# Patient Record
Sex: Male | Born: 1980 | Race: White | Hispanic: Yes | Marital: Single | Smoking: Never smoker
Health system: Southern US, Community
[De-identification: ages and names within clinical notes are randomized; demographics above are authoritative.]

## PROBLEM LIST (undated history)

## (undated) HISTORY — PX: WISDOM TOOTH EXTRACTION: SHX21

---

## 2003-03-27 ENCOUNTER — Encounter: Payer: Self-pay | Admitting: Specialist

## 2003-03-27 ENCOUNTER — Encounter: Admission: RE | Admit: 2003-03-27 | Discharge: 2003-03-27 | Payer: Self-pay | Admitting: Specialist

## 2007-12-04 ENCOUNTER — Emergency Department (HOSPITAL_COMMUNITY): Admission: EM | Admit: 2007-12-04 | Discharge: 2007-12-04 | Payer: Self-pay | Admitting: Emergency Medicine

## 2009-02-05 IMAGING — CT CT PELVIS W/ CM
2 of 5 series · 13 of 32 positions shown, 18 images · IV contrast (omnipaque)
Comparison: Ultrasound 12/04/07.

CLINICAL DATA: 26-year-old, abdominal pain, elevated liver function studies. 
 ABDOMEN CT WITH CONTRAST:
TECHNIQUE: Multidetector CT imaging of the abdomen was performed following the standard protocol during bolus administration of intravenous contrast.
 Contrast:  100 cc Omnipaque 300
TECHNIQUE: Multidetector CT imaging of the pelvis was performed following the standard protocol during bolus administration of intravenous contrast.

[Series 2: routine abdomen · axial · 0.82mm/px · z∈[-378,-78]mm · 5 of 88 slices shown, 10 images]
[im 15/88  soft-tissue]
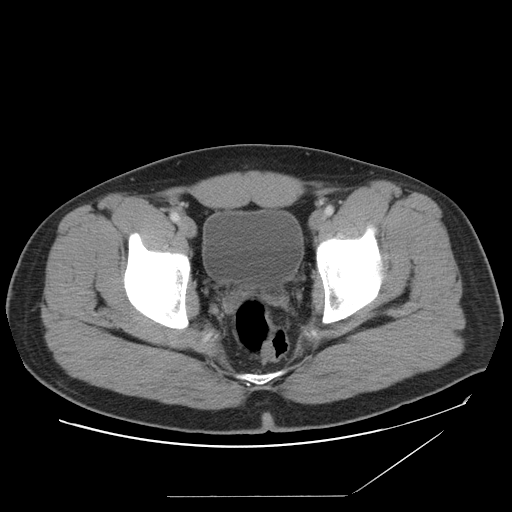
[im 15/88  bone]
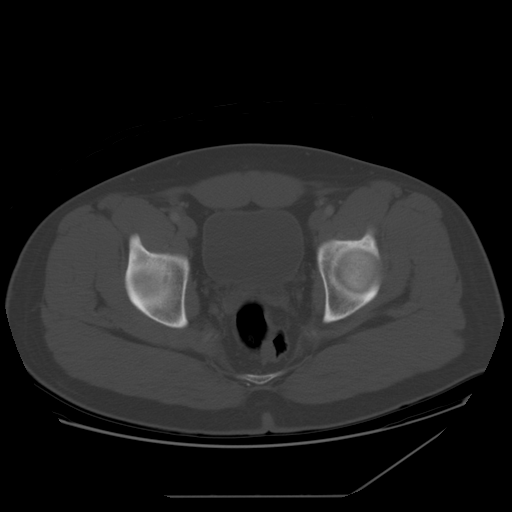
[im 30/88  soft-tissue]
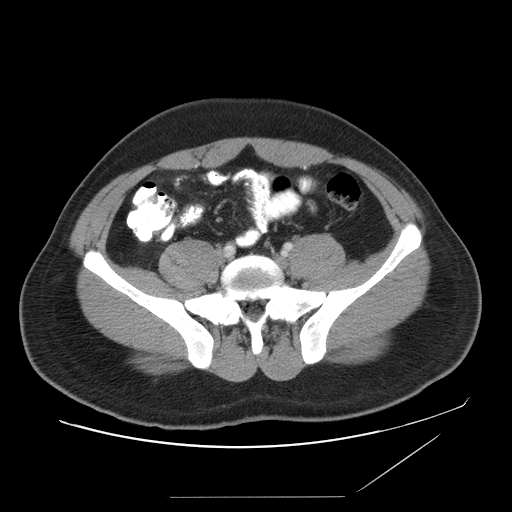
[im 30/88  lung]
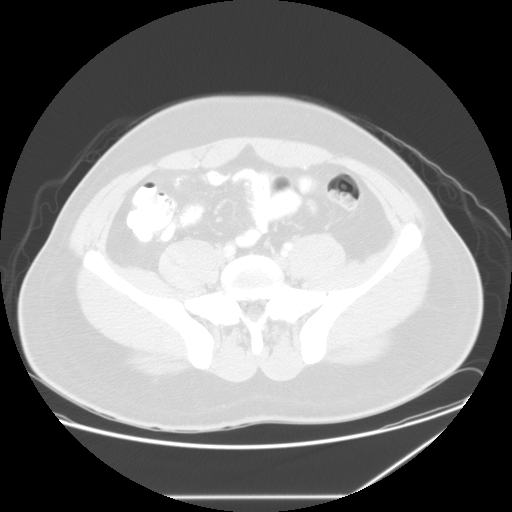
[im 44/88  soft-tissue]
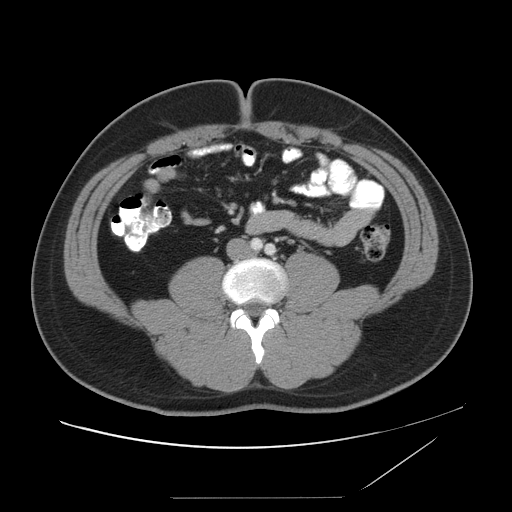
[im 44/88  lung]
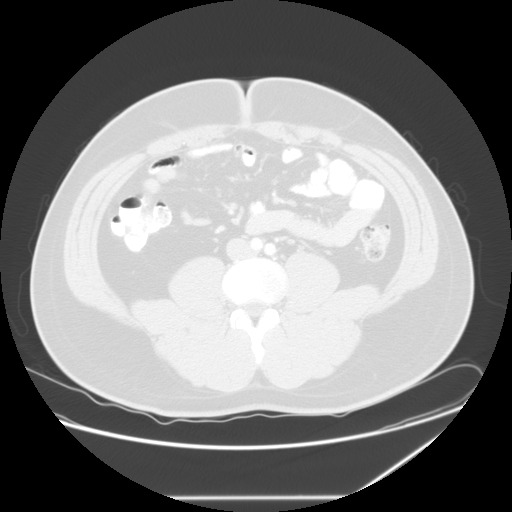
[im 59/88  soft-tissue]
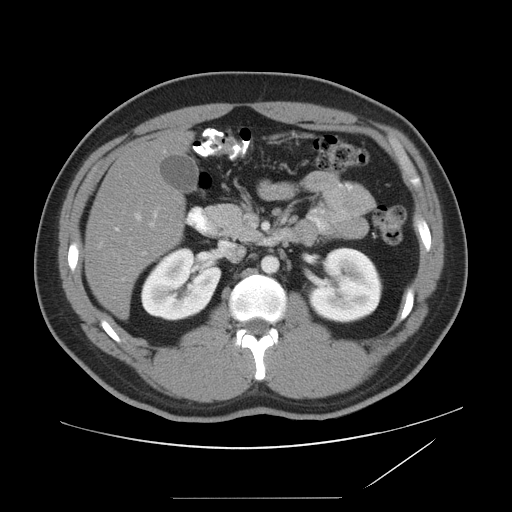
[im 59/88  lung]
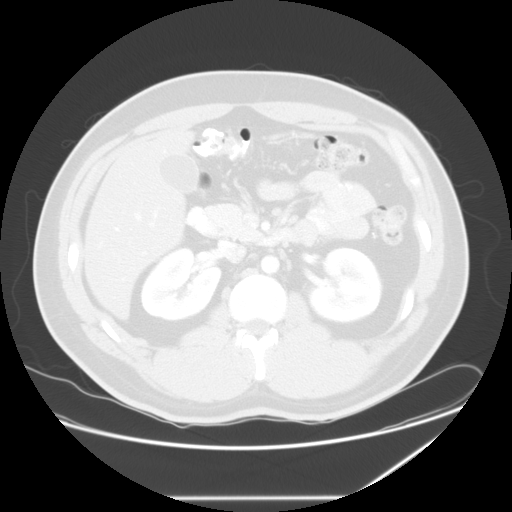
[im 73/88  soft-tissue]
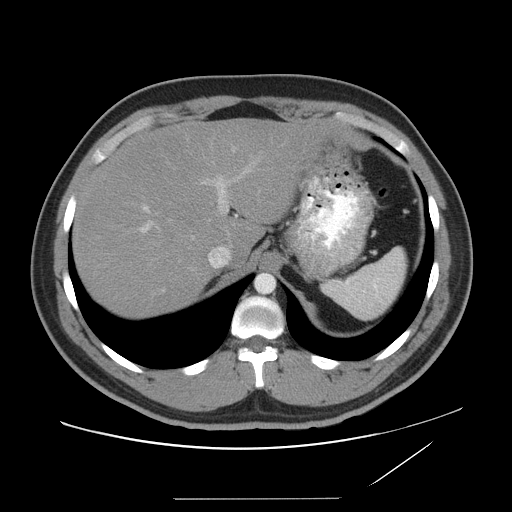
[im 73/88  lung]
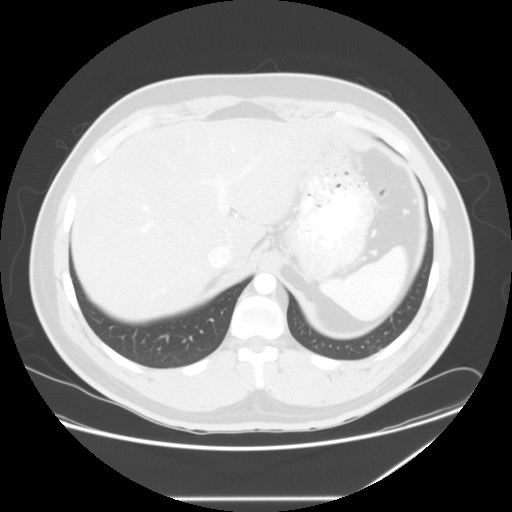

[Series 400: reformatted · sagittal · 0.96mm/px · 8 of 172 slices shown]
[im 16/172  soft-tissue]
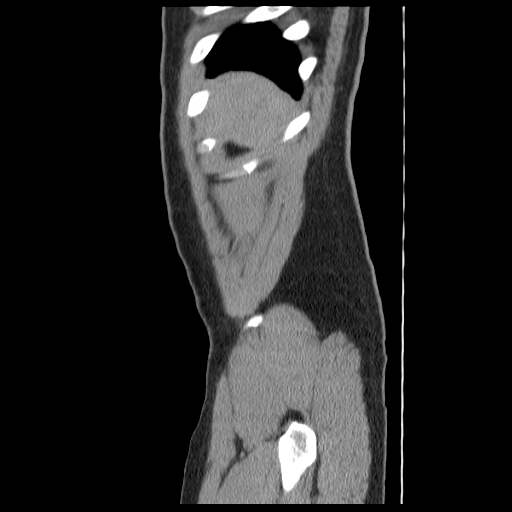
[im 32/172  soft-tissue]
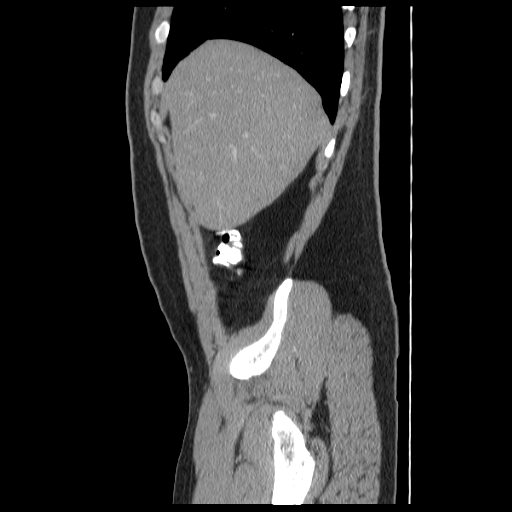
[im 63/172  soft-tissue]
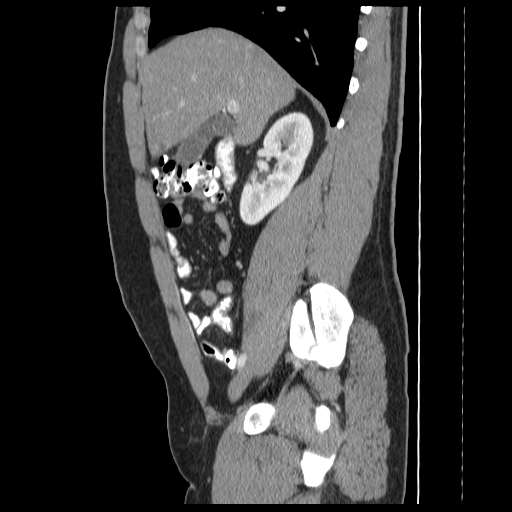
[im 78/172  soft-tissue]
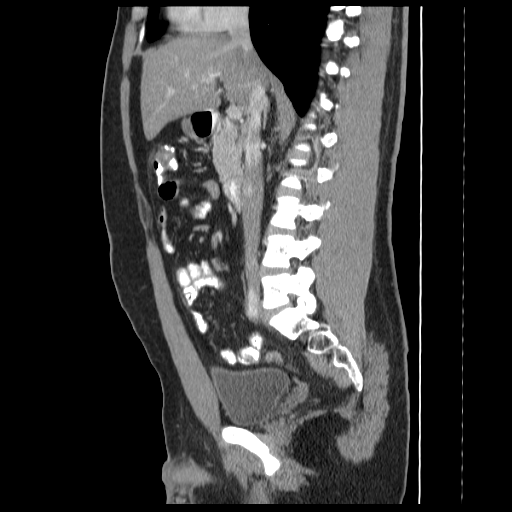
[im 94/172  soft-tissue]
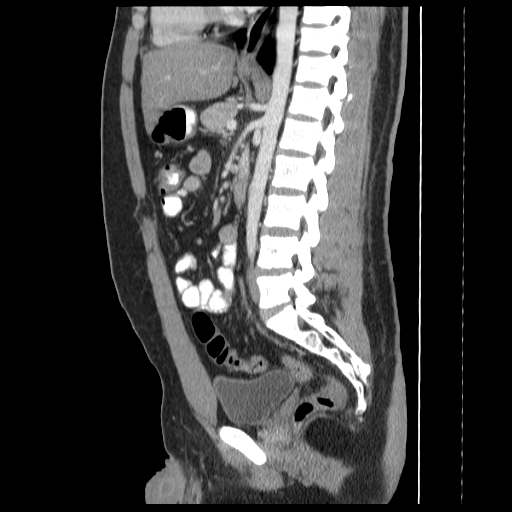
[im 109/172  soft-tissue]
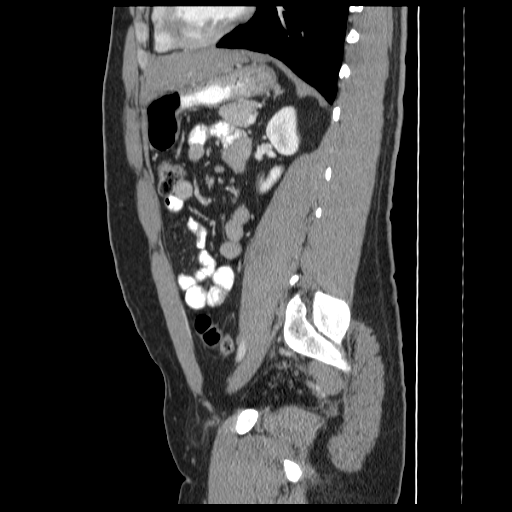
[im 140/172  soft-tissue]
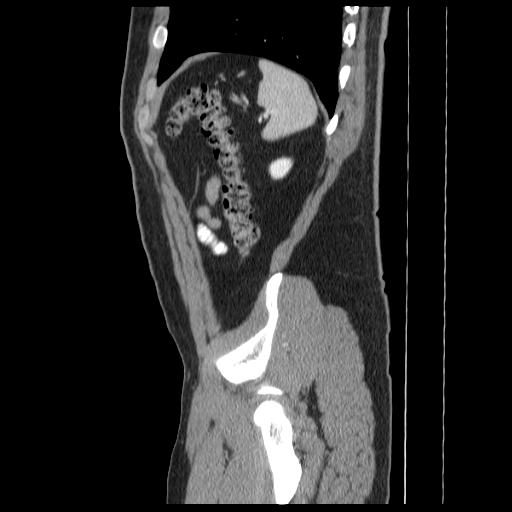
[im 156/172  soft-tissue]
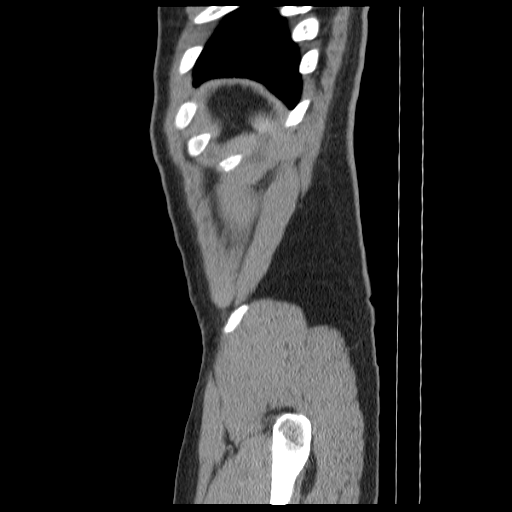

[13 of 32 positions shown; findings below may reference images not displayed]

FINDINGS: The lung bases are clear. 
 There is diffuse fatty infiltration of the liver with an area of focal fatty sparing near the gallbladder fossa which accounts for the ultrasound findings.  No biliary dilatation.  
 The spleen is normal in size.  The pancreas, adrenal glands, and kidneys are unremarkable.  
 The stomach, duodenum, small bowel, and colon demonstrate no significant abnormalities.
 There are scattered borderline mesenteric and retroperitoneal lymph nodes.  This is a nonspecific finding but may be normal in this young patient.   It could also be due to mesenteric adenitis.  
 The aorta is normal in caliber.  No dissection.  Major branch vessels are normal.  No significant bony findings.
IMPRESSION: 1.  Diffuse fatty infiltration of the liver with focal fatty sparing near the gallbladder fossa. 
 2.  Borderline enlarged mesenteric and retroperitoneal lymph nodes may suggest mesenteric adenitis. 
 3.  Remainder of the abdomen is unremarkable. 
 PELVIS CT WITH CONTRAST:
FINDINGS: The rectum, sigmoid colon, and visualized small bowel loops are unremarkable.  The appendix is visualized and is normal.  The bladder is normal.  No pelvic masses, adenopathy, or free pelvic fluid collections.  No inguinal adenopathy or inguinal hernia.  The bony pelvis is intact.
IMPRESSION: No acute pelvic findings, masses, or adenopathy.  The appendix is visualized and is normal.  Borderline enlarged pericecal lymph nodes are seen.

## 2009-08-24 ENCOUNTER — Emergency Department (HOSPITAL_COMMUNITY): Admission: EM | Admit: 2009-08-24 | Discharge: 2009-08-25 | Payer: Self-pay | Admitting: Emergency Medicine

## 2009-09-29 ENCOUNTER — Emergency Department (HOSPITAL_COMMUNITY): Admission: EM | Admit: 2009-09-29 | Discharge: 2009-09-30 | Payer: Self-pay | Admitting: Emergency Medicine

## 2009-09-30 ENCOUNTER — Ambulatory Visit: Payer: Self-pay | Admitting: Psychiatry

## 2009-09-30 ENCOUNTER — Inpatient Hospital Stay (HOSPITAL_COMMUNITY): Admission: RE | Admit: 2009-09-30 | Discharge: 2009-10-01 | Payer: Self-pay | Admitting: Psychiatry

## 2010-03-18 ENCOUNTER — Ambulatory Visit: Payer: Self-pay | Admitting: Internal Medicine

## 2011-01-02 LAB — ETHANOL: Alcohol, Ethyl (B): 253 mg/dL — ABNORMAL HIGH (ref 0–10)

## 2011-01-02 LAB — CBC
HCT: 46.2 % (ref 39.0–52.0)
Hemoglobin: 15.8 g/dL (ref 13.0–17.0)
MCV: 92.2 fL (ref 78.0–100.0)
Platelets: 296 10*3/uL (ref 150–400)
WBC: 6.9 10*3/uL (ref 4.0–10.5)

## 2011-01-02 LAB — BASIC METABOLIC PANEL
BUN: 8 mg/dL (ref 6–23)
Chloride: 106 mEq/L (ref 96–112)
Potassium: 3.8 mEq/L (ref 3.5–5.1)

## 2011-01-02 LAB — RAPID URINE DRUG SCREEN, HOSP PERFORMED
Cocaine: NOT DETECTED
Opiates: NOT DETECTED

## 2011-01-02 LAB — DIFFERENTIAL
Eosinophils Absolute: 0 10*3/uL (ref 0.0–0.7)
Eosinophils Relative: 1 % (ref 0–5)
Lymphs Abs: 3.6 10*3/uL (ref 0.7–4.0)
Monocytes Absolute: 0.5 10*3/uL (ref 0.1–1.0)
Monocytes Relative: 8 % (ref 3–12)

## 2011-01-02 LAB — HEPATIC FUNCTION PANEL
Alkaline Phosphatase: 85 U/L (ref 39–117)
Bilirubin, Direct: 0.2 mg/dL (ref 0.0–0.3)
Indirect Bilirubin: 0.7 mg/dL (ref 0.3–0.9)
Total Bilirubin: 0.9 mg/dL (ref 0.3–1.2)
Total Protein: 7.4 g/dL (ref 6.0–8.3)

## 2011-01-02 LAB — RPR: RPR Ser Ql: NONREACTIVE

## 2011-06-26 LAB — COMPREHENSIVE METABOLIC PANEL
AST: 511 — ABNORMAL HIGH
Albumin: 4
BUN: 10
CO2: 27
Calcium: 9.8
Creatinine, Ser: 0.83
GFR calc Af Amer: >60
GFR calc non Af Amer: >60
Total Bilirubin: 0.7

## 2011-06-26 LAB — URINALYSIS, ROUTINE W REFLEX MICROSCOPIC
Bilirubin Urine: NEGATIVE
Glucose, UA: NEGATIVE
Hgb urine dipstick: NEGATIVE
Ketones, ur: NEGATIVE
Protein, ur: NEGATIVE
Urobilinogen, UA: 1

## 2011-06-26 LAB — DIFFERENTIAL
Basophils Absolute: 0
Eosinophils Relative: 1
Lymphocytes Relative: 54 — ABNORMAL HIGH
Lymphs Abs: 3.5
Neutro Abs: 2.3

## 2011-06-26 LAB — CBC
HCT: 46.7
MCHC: 34.4
MCV: 89.4
Platelets: 254

## 2011-06-26 LAB — LIPASE, BLOOD: Lipase: 29

## 2012-03-19 ENCOUNTER — Encounter (HOSPITAL_COMMUNITY): Payer: Self-pay | Admitting: *Deleted

## 2012-03-19 ENCOUNTER — Emergency Department (HOSPITAL_COMMUNITY): Payer: Self-pay

## 2012-03-19 ENCOUNTER — Emergency Department (HOSPITAL_COMMUNITY)
Admission: EM | Admit: 2012-03-19 | Discharge: 2012-03-19 | Disposition: A | Discharge status: Home / Self Care | Payer: Self-pay | Attending: Emergency Medicine | Admitting: Emergency Medicine

## 2012-03-19 DIAGNOSIS — S61309A Unspecified open wound of unspecified finger with damage to nail, initial encounter: Secondary | ICD-10-CM

## 2012-03-19 DIAGNOSIS — S61209A Unspecified open wound of unspecified finger without damage to nail, initial encounter: Secondary | ICD-10-CM | POA: Insufficient documentation

## 2012-03-19 DIAGNOSIS — IMO0002 Reserved for concepts with insufficient information to code with codable children: Secondary | ICD-10-CM | POA: Insufficient documentation

## 2012-03-19 MED ORDER — HYDROCODONE-ACETAMINOPHEN 5-325 MG PO TABS: 1.0000 | ORAL_TABLET | ORAL | Status: AC | PRN

## 2012-03-19 NOTE — Discharge Instructions (Signed)
Fingernail Removal Fingernails may need to be removed because of injury, infections, or correction of abnormal growth. A special non-stick bandage has been put on your finger tightly to prevent bleeding. Fingernails will usually grow back if the finger has not been badly injured and you carefully follow instructions. HOME CARE INSTRUCTIONS   Keep your hand elevated above your heart to relieve pain and swelling.   Keep your dressing dry and clean.   Change your bandage in 24 hours.   After your bandage is changed, soak your hand in warm soapy water for 10 to 20 minutes. Do this 3 times per day. This helps reduce pain and swelling. After soaking your hand, apply a clean, dry bandage. Change your bandage if it is wet or dirty.   Only take over-the-counter or prescription medicines for pain, discomfort, or fever as directed by your caregiver.   See your caregiver as needed for problems.   You may have received an instruction to follow up with your caregiver or a specialist. The failure to follow up as instructed could result in the permanent loss of a fingernail.  SEEK IMMEDIATE MEDICAL CARE IF:   You have increased pain, swelling, drainage, or bleeding.   You have a fever.  MAKE SURE YOU:   Understand these instructions.   Will watch your condition.   Will get help right away if you are not doing well or get worse.  Document Released: 09/15/2000 Document Revised: 09/07/2011 Document Reviewed: 01/21/2008 [facility] Patient Information 2012 [place], [place].Fingernail or Toenail Loss All or part of your fingernail or toenail has been lost. This may or may not grow back as a normal nail. A special non-stick bandage has been put on your finger or toe tightly to prevent bleeding. HOME CARE INSTRUCTIONS  The tips of fingers and toes are full of nerves and injuries are often very painful. The following will help you decrease the pain and obtain the best outcome.  Keep your hand or foot elevated  above your heart to relieve pain and swelling. This will require lying in bed or on a couch with the hand or leg on pillows or sitting in a recliner with the leg up. Letting your hand or leg dangle may increase swelling, slow healing and cause throbbing pain.   Keep your dressing dry and clean.   Change your bandage in 24 hours after going home.   After your bandage is changed, soak your hand or foot in warm soapy water for 10 to 20 minutes. Do this 3 times per day. This helps reduce pain and swelling. After soaking, apply a clean, dry bandage. Change your bandage if it is wet or dirty.   Only take over-the-counter or prescription medicines for pain, discomfort, or fever as directed by your caregiver.   See your caregiver as needed for problems.  SEEK IMMEDIATE MEDICAL CARE IF:   You have increased pain, swelling, drainage, or bleeding.   You have a fever.  MAKE SURE YOU:   Understand these instructions.   Will watch your condition.   Will get help right away if you are not doing well or get worse.  Document Released: 08/10/2006 Document Revised: 09/07/2011 Document Reviewed: 10/30/2006 [facility] Patient Information 2012 [place], [place].

## 2012-03-19 NOTE — ED Notes (Signed)
The pt  Struck his lt ring finger with a brick one week ago.  Now the fingernail is free from the nail bed.

## 2012-03-19 NOTE — ED Notes (Signed)
PT states no needs at this time

## 2012-03-19 NOTE — ED Provider Notes (Signed)
Medical screening examination/treatment/procedure(s) were performed by non-physician practitioner and as supervising physician I was immediately available for consultation/collaboration.   Vasti Yagi, MD 03/19/12 2309 

## 2012-03-19 NOTE — ED Provider Notes (Signed)
History     CSN: [id number]  Arrival date & time 03/19/12  1927   First MD Initiated Contact with Patient 03/19/12 2041      Chief Complaint  Patient presents with  . Finger Injury    (Consider location/radiation/quality/duration/timing/severity/associated sxs/prior treatment) HPI Comments: Patient here after hitting his left 4th finger with a brick - states that he did this 1 week ago and now reports that the nail is beginning to come off - has been working with the injury - tetanus UTD - hemostatic  Patient is a 31 y.o. male presenting with hand pain. The history is provided by the patient. No language interpreter was used.  Hand Pain This is a new problem. The current episode started in the past 7 days. The problem occurs constantly. The problem has been unchanged. Associated symptoms include arthralgias and joint swelling. Pertinent negatives include no abdominal pain, anorexia, change in bowel habit, chest pain, chills, congestion, coughing, diaphoresis, fatigue, fever, headaches, myalgias, nausea, neck pain, numbness, rash, sore throat, swollen glands, urinary symptoms, vertigo, visual change, vomiting or weakness. The symptoms are aggravated by bending. He has tried nothing for the symptoms. The treatment provided no relief.    History reviewed. No pertinent past medical history.  History reviewed. No pertinent past surgical history.  No family history on file.  History  Substance Use Topics  . Smoking status: Never Smoker   . Smokeless tobacco: Not on file  . Alcohol Use: Yes      Review of Systems  Constitutional: Negative for fever, chills, diaphoresis and fatigue.  HENT: Negative for congestion, sore throat and neck pain.   Respiratory: Negative for cough.   Cardiovascular: Negative for chest pain.  Gastrointestinal: Negative for nausea, vomiting, abdominal pain, anorexia and change in bowel habit.  Musculoskeletal: Positive for joint swelling and arthralgias.  Negative for myalgias.  Skin: Negative for rash.  Neurological: Negative for vertigo, weakness, numbness and headaches.  All other systems reviewed and are negative.    Allergies  Review of patient's allergies indicates no known allergies.  Home Medications   Current Outpatient Rx  Name Route Sig Dispense Refill  . HYDROCODONE-ACETAMINOPHEN 5-325 MG PO TABS Oral Take 1 tablet by mouth every 4 (four) hours as needed for pain. 20 tablet 0    BP 135/93  Pulse 74  Temp 97.8 F (36.6 C) (Oral)  Resp 18  SpO2 98%  Physical Exam  Nursing note and vitals reviewed. Constitutional: He is oriented to person, place, and time. He appears well-developed and well-nourished. No distress.  HENT:  Head: Normocephalic and atraumatic.  Right Ear: External ear normal.  Left Ear: External ear normal.  Nose: Nose normal.  Mouth/Throat: Oropharynx is clear and moist. No oropharyngeal exudate.  Eyes: Conjunctivae are normal. Pupils are equal, round, and reactive to light. No scleral icterus.  Neck: Normal range of motion. Neck supple.  Cardiovascular: Normal rate, regular rhythm and normal heart sounds.  Exam reveals no gallop and no friction rub.   No murmur heard. Pulmonary/Chest: Effort normal and breath sounds normal. No respiratory distress. He has no wheezes. He has no rales. He exhibits no tenderness.  Abdominal: Soft. Bowel sounds are normal. He exhibits no distension. There is no tenderness.  Musculoskeletal: Normal range of motion. He exhibits tenderness. He exhibits no edema.  Lymphadenopathy:    He has no cervical adenopathy.  Neurological: He is alert and oriented to person, place, and time. No cranial nerve deficit. He exhibits normal muscle tone.  Coordination normal.  Skin: Skin is warm and dry. No rash noted. No erythema. No pallor.       Partial avulsion of left 4th finger nail  Psychiatric: He has a normal mood and affect. His behavior is normal. Judgment and thought content  normal.    ED Course  NAIL REMOVAL Date/Time: 03/19/2012 10:43 PM Performed by: Marisue Humble, Jenafer Winterton C. Authorized by: Patrecia Pour Consent: Verbal consent obtained. Written consent not obtained. Risks and benefits: risks, benefits and alternatives were discussed Consent given by: patient and spouse Patient understanding: patient states understanding of the procedure being performed Patient consent: the patient's understanding of the procedure does not match consent given Procedure consent: procedure consent does not match procedure scheduled Relevant documents: relevant documents not present or verified Test results: test results not available Site marked: the operative site was not marked Imaging studies: imaging studies not available Patient identity confirmed: verbally with patient and arm band Time out: Immediately prior to procedure a "time out" was called to verify the correct patient, procedure, equipment, support staff and site/side marked as required. Location: left hand Location details: left ring finger Anesthesia: digital block Local anesthetic: lidocaine 1% without epinephrine Anesthetic total: 4 ml Patient sedated: no Preparation: skin prepped with Betadine Amount removed: complete Wedge excision of skin of nail fold: no Nail bed sutured: no Nail matrix removed: none Removed nail replaced and anchored: no Dressing: antibiotic ointment, gauze roll and splint Patient tolerance: Patient tolerated the procedure well with no immediate complications.   (including critical care time)  Labs Reviewed - No data to display Dg Finger Ring Left  03/19/2012  *RADIOLOGY REPORT*  Clinical Data: Finger injury  LEFT RING FINGER 2+V  Comparison: None.  Findings: Three views of the left fourth finger submitted.  No acute fracture or subluxation.  Soft tissue swelling/irregularity noted dorsal aspect of distal finger  IMPRESSION: No acute fracture or subluxation.  Soft tissue  injury noted dorsal distal aspect of the finger.  Original Report Authenticated By: Natasha Mead, M.D.     1. Nail avulsion, finger       MDM  Patient here with partial avulsion of nail with detachment of the nail from the bed, remainder of nail removed and nail bed examined - small hematoma noted but no laceration of the bed noted.  Placed in splint and bacitracin ointment.  Patient did have a vasovagal response after removal of nail and passed out for about 30 seconds, came to spontaneously without complications.        Izola Price [place], [place] 03/19/12 2246

## 2012-04-02 ENCOUNTER — Emergency Department (HOSPITAL_COMMUNITY)
Admission: EM | Admit: 2012-04-02 | Discharge: 2012-04-02 | Disposition: A | Discharge status: Home / Self Care | Payer: Self-pay | Attending: Emergency Medicine | Admitting: Emergency Medicine

## 2012-04-02 ENCOUNTER — Encounter (HOSPITAL_COMMUNITY): Payer: Self-pay

## 2012-04-02 DIAGNOSIS — T622X1A Toxic effect of other ingested (parts of) plant(s), accidental (unintentional), initial encounter: Secondary | ICD-10-CM | POA: Insufficient documentation

## 2012-04-02 DIAGNOSIS — L259 Unspecified contact dermatitis, unspecified cause: Secondary | ICD-10-CM

## 2012-04-02 DIAGNOSIS — L255 Unspecified contact dermatitis due to plants, except food: Secondary | ICD-10-CM | POA: Insufficient documentation

## 2012-04-02 MED ORDER — PREDNISONE 20 MG PO TABS
40.0000 mg | ORAL_TABLET | Freq: Once | ORAL | Status: AC
  Administered 2012-04-02: 40 mg via ORAL
  Filled 2012-04-02: qty 2

## 2012-04-02 MED ORDER — PREDNISONE 20 MG PO TABS: 40.0000 mg | ORAL_TABLET | Freq: Every day | ORAL | Status: AC

## 2012-04-02 MED ORDER — HYDROXYZINE HCL 25 MG PO TABS: 25.0000 mg | ORAL_TABLET | Freq: Four times a day (QID) | ORAL | Status: AC

## 2012-04-02 MED ORDER — DEXAMETHASONE SODIUM PHOSPHATE 10 MG/ML IJ SOLN
10.0000 mg | Freq: Once | INTRAMUSCULAR | Status: AC
  Administered 2012-04-02: 10 mg via INTRAVENOUS
  Filled 2012-04-02: qty 1

## 2012-04-02 MED ORDER — PREDNISONE 20 MG PO TABS: 60.0000 mg | ORAL_TABLET | Freq: Every day | ORAL | Status: AC

## 2012-04-02 NOTE — ED Provider Notes (Signed)
History     CSN: [id number]  Arrival date & time 04/02/12  1939   First MD Initiated Contact with Patient 04/02/12 2057      Chief Complaint  Patient presents with  . Rash    (Consider location/radiation/quality/duration/timing/severity/associated sxs/prior treatment) Patient is a 31 y.o. male presenting with rash. The history is provided by the patient and the spouse.  Rash  There has been no fever.   31 y/op male iNAd c/o itching rash to bilateral arms and legs x1.5 weeks after doing yard work. Pt knows he was exposed to poison ivy. He put 1:1 water to bleach solution to rinse the area.   History reviewed. No pertinent past medical history.  History reviewed. No pertinent past surgical history.  History reviewed. No pertinent family history.  History  Substance Use Topics  . Smoking status: Never Smoker   . Smokeless tobacco: Not on file  . Alcohol Use: Yes     approx 100oz per day of beer.      Review of Systems  Skin: Positive for rash.  All other systems reviewed and are negative.    Allergies  Anesthetics, amide  Home Medications   Current Outpatient Rx  Name Route Sig Dispense Refill  . PREDNISONE 20 MG PO TABS Oral Take 2 tablets (40 mg total) by mouth daily. 20 tablet 0    BP 140/77  Pulse 72  Temp 98.1 F (36.7 C) (Oral)  Resp 16  SpO2 99%  Physical Exam  Vitals reviewed. Constitutional: He is oriented to person, place, and time. He appears well-developed and well-nourished. No distress.  HENT:  Head: Normocephalic.  Eyes: Conjunctivae and EOM are normal.  Cardiovascular: Normal rate.   Pulmonary/Chest: Effort normal.  Musculoskeletal: Normal range of motion.  Neurological: He is alert and oriented to person, place, and time.  Skin:       excoriated lesions bilateral distal arms and lower legs. No signs of infection: redness warmth, tenderness or discharge  Psychiatric: He has a normal mood and affect.    ED Course  Procedures  (including critical care time)  Labs Reviewed - No data to display No results found.   1. Contact dermatitis   2. Contact dermatitis due to plants, except food       MDM  Pruritic rash consistent with poison ivy. No signs if secondary infection. Will give 10 mg decadron IM and write for 21 day steriod taper and give atarax for symptomatic relief       Wynetta Emery, PA-C 04/02/12 2140

## 2012-04-02 NOTE — ED Notes (Signed)
States he was mowing the yard about 1.5 wks ago and states it is poison ivy.  Has a rash to arms and legs. States it is getting worse.

## 2012-04-10 NOTE — ED Provider Notes (Signed)
Medical screening examination/treatment/procedure(s) were performed by non-physician practitioner and as supervising physician I was immediately available for consultation/collaboration.  Cambridge Deleo, MD 04/10/12 0851 

## 2014-06-09 ENCOUNTER — Encounter (HOSPITAL_COMMUNITY): Payer: Self-pay | Admitting: Emergency Medicine

## 2014-06-09 ENCOUNTER — Emergency Department (HOSPITAL_COMMUNITY)
Admission: EM | Admit: 2014-06-09 | Discharge: 2014-06-09 | Disposition: A | Discharge status: Home / Self Care | Payer: Medicaid Other | Attending: Emergency Medicine | Admitting: Emergency Medicine

## 2014-06-09 DIAGNOSIS — M545 Low back pain, unspecified: Secondary | ICD-10-CM | POA: Insufficient documentation

## 2014-06-09 DIAGNOSIS — M5432 Sciatica, left side: Secondary | ICD-10-CM

## 2014-06-09 DIAGNOSIS — R61 Generalized hyperhidrosis: Secondary | ICD-10-CM | POA: Diagnosis not present

## 2014-06-09 DIAGNOSIS — M5430 Sciatica, unspecified side: Secondary | ICD-10-CM | POA: Diagnosis not present

## 2014-06-09 DIAGNOSIS — Z79899 Other long term (current) drug therapy: Secondary | ICD-10-CM | POA: Diagnosis not present

## 2014-06-09 MED ORDER — OXYCODONE-ACETAMINOPHEN 10-325 MG PO TABS: 1.0000 | ORAL_TABLET | ORAL | Status: DC | PRN

## 2014-06-09 MED ORDER — MELOXICAM 15 MG PO TABS: 15.0000 mg | ORAL_TABLET | Freq: Every day | ORAL | Status: DC

## 2014-06-09 MED ORDER — OXYCODONE-ACETAMINOPHEN 5-325 MG PO TABS
2.0000 | ORAL_TABLET | Freq: Once | ORAL | Status: AC
  Administered 2014-06-09: 2 via ORAL
  Filled 2014-06-09: qty 2

## 2014-06-09 MED ORDER — ONDANSETRON HCL 4 MG/2ML IJ SOLN: 4.0000 mg | Freq: Once | INTRAMUSCULAR | Status: DC

## 2014-06-09 NOTE — ED Notes (Signed)
Pt reports left lower back pain x 4 months that is worse today with radiating pain down to left leg to level of the knee. Denies bowel or bladder problems but does reports that it hurts his back to sit and have a bowel movement. Last BM yesterday. He denies fall or injury or numbness to his toes or foot.

## 2014-06-09 NOTE — ED Provider Notes (Signed)
CSN: [id number]     Arrival date & time 06/09/14  1011 History   First MD Initiated Contact with Patient 06/09/14 1024    This chart was scribed for Arthor Captain, PA, with Ward Givens, MD by Tonye Royalty, ED Scribe. This patient was seen in room WTR8/WTR8 and the patient's care was started at 12:07 PM.   Chief Complaint  Patient presents with  . Back Pain   The history is provided by the patient. No language interpreter was used.    HPI Comments: Brian Kim is a 33 y.o. male who presents to the Emergency Department complaining of sharp constant back pain from his lower back down to left knee with onset 3 months ago. He recalls lifting something heavy a few days before onset of pain. He states pain is better when standing and moving around but worse when sitting or laying. He states the pain worsened 3 weeks ago but denies knowledge of what might have made made it worse. He states he is current taking OTC pain medication without significant relief; he states he was previously prescribed pain medication that mediated pain but did not resolve it. He reports associated night sweats from the waist down and difficulty using the restroom. He denies numbness, tingling, weakness, loss of bladder, loss of function, fever, or chillls.  History reviewed. No pertinent past medical history. History reviewed. No pertinent past surgical history. No family history on file. History  Substance Use Topics  . Smoking status: Never Smoker   . Smokeless tobacco: Not on file  . Alcohol Use: Yes     Comment: approx 100oz per day of beer.    Review of Systems  Constitutional: Positive for diaphoresis (night sweats). Negative for fever and chills.  Musculoskeletal: Positive for back pain (stretching down through buttock to back of left knee).  Neurological: Negative for weakness, light-headedness and numbness.       Denies loss of bowel or bladder    Allergies  Anesthetics, amide  Home Medications    Prior to Admission medications   Medication Sig Start Date End Date Taking? Authorizing Provider  acetaminophen (TYLENOL) 325 MG tablet Take 650-975 mg by mouth every 6 (six) hours as needed for moderate pain.   Yes Historical Provider, MD  ibuprofen (ADVIL,MOTRIN) 200 MG tablet Take 1,200 mg by mouth every 6 (six) hours as needed for moderate pain.   Yes Historical Provider, MD  meloxicam (MOBIC) 15 MG tablet Take 1 tablet (15 mg total) by mouth daily. 06/09/14   Arthor Captain, PA-C  oxyCODONE-acetaminophen (PERCOCET) 10-325 MG per tablet Take 1 tablet by mouth every 4 (four) hours as needed for pain. 06/09/14   Arturo Freundlich, PA-C   BP 126/80  Pulse 80  Temp(Src) 98.6 F (37 C) (Oral)  Resp 18  Wt 199 lb (90.266 kg)  SpO2 100% Physical Exam  Nursing note and vitals reviewed. Constitutional: He is oriented to person, place, and time. He appears well-developed and well-nourished.  HENT:  Head: Normocephalic and atraumatic.  Eyes: Conjunctivae are normal.  Neck: Normal range of motion. Neck supple.  Pulmonary/Chest: Effort normal.  Musculoskeletal: Normal range of motion.  Positive straight leg raise  Neurological: He is alert and oriented to person, place, and time. He displays normal reflexes.  Normal strength in legs  Skin: Skin is warm and dry.  Psychiatric: He has a normal mood and affect.    ED Course  Procedures (including critical care time) Labs Review Labs Reviewed -  No data to display  Imaging Review No results found.   EKG Interpretation None     DIAGNOSTIC STUDIES: Oxygen Saturation is 100% on room air, normal by my interpretation.    COORDINATION OF CARE:    MDM   Final diagnoses:  Sciatica, left   Patient with back pain.  No neurological deficits and normal neuro exam.  Patient can walk but states is painful.  No loss of bowel or bladder control.  No concern for cauda equina.  No fever, night sweats, weight loss, h/o cancer, IVDU.  RICE protocol  and pain medicine indicated and discussed with patient.   I personally performed the services described in this documentation, which was scribed in my presence. The recorded information has been reviewed and is accurate.      Arthor Captain, PA-C 06/14/14 1936

## 2014-06-09 NOTE — Discharge Instructions (Signed)
Sciatica °Sciatica is pain, weakness, numbness, or tingling along the path of the sciatic nerve. The nerve starts in the lower back and runs down the back of each leg. The nerve controls the muscles in the lower leg and in the back of the knee, while also providing sensation to the back of the thigh, lower leg, and the sole of your foot. Sciatica is a symptom of another medical condition. For instance, nerve damage or certain conditions, such as a herniated disk or bone spur on the spine, pinch or put pressure on the sciatic nerve. This causes the pain, weakness, or other sensations normally associated with sciatica. Generally, sciatica only affects one side of the body. °CAUSES  °· Herniated or slipped disc. °· Degenerative disk disease. °· A pain disorder involving the narrow muscle in the buttocks (piriformis syndrome). °· Pelvic injury or fracture. °· Pregnancy. °· Tumor (rare). °SYMPTOMS  °Symptoms can vary from mild to very severe. The symptoms usually travel from the low back to the buttocks and down the back of the leg. Symptoms can include: °· Mild tingling or dull aches in the lower back, leg, or hip. °· Numbness in the back of the calf or sole of the foot. °· Burning sensations in the lower back, leg, or hip. °· Sharp pains in the lower back, leg, or hip. °· Leg weakness. °· Severe back pain inhibiting movement. °These symptoms may get worse with coughing, sneezing, laughing, or prolonged sitting or standing. Also, being overweight may worsen symptoms. °DIAGNOSIS  °Your caregiver will perform a physical exam to look for common symptoms of sciatica. He or she may ask you to do certain movements or activities that would trigger sciatic nerve pain. Other tests may be performed to find the cause of the sciatica. These may include: °· Blood tests. °· X-rays. °· Imaging tests, such as an MRI or CT scan. °TREATMENT  °Treatment is directed at the cause of the sciatic pain. Sometimes, treatment is not necessary  and the pain and discomfort goes away on its own. If treatment is needed, your caregiver may suggest: °· Over-the-counter medicines to relieve pain. °· Prescription medicines, such as anti-inflammatory medicine, muscle relaxants, or narcotics. °· Applying heat or ice to the painful area. °· Steroid injections to lessen pain, irritation, and inflammation around the nerve. °· Reducing activity during periods of pain. °· Exercising and stretching to strengthen your abdomen and improve flexibility of your spine. Your caregiver may suggest losing weight if the extra weight makes the back pain worse. °· Physical therapy. °· Surgery to eliminate what is pressing or pinching the nerve, such as a bone spur or part of a herniated disk. °HOME CARE INSTRUCTIONS  °· Only take over-the-counter or prescription medicines for pain or discomfort as directed by your caregiver. °· Apply ice to the affected area for 20 minutes, 3-4 times a day for the first 48-72 hours. Then try heat in the same way. °· Exercise, stretch, or perform your usual activities if these do not aggravate your pain. °· Attend physical therapy sessions as directed by your caregiver. °· Keep all follow-up appointments as directed by your caregiver. °· Do not wear high heels or shoes that do not provide proper support. °· Check your mattress to see if it is too soft. A firm mattress may lessen your pain and discomfort. °SEEK IMMEDIATE MEDICAL CARE IF:  °· You lose control of your bowel or bladder (incontinence). °· You have increasing weakness in the lower back, pelvis, buttocks,   or legs. °· You have redness or swelling of your back. °· You have a burning sensation when you urinate. °· You have pain that gets worse when you lie down or awakens you at night. °· Your pain is worse than you have experienced in the past. °· Your pain is lasting longer than 4 weeks. °· You are suddenly losing weight without reason. °MAKE SURE YOU: °· Understand these  instructions. °· Will watch your condition. °· Will get help right away if you are not doing well or get worse. °Document Released: 09/12/2001 Document Revised: 03/19/2012 Document Reviewed: 01/28/2012 °[employer]® Patient Information ©2015 [employer]. This information is not intended to replace advice given to you by your health care provider. Make sure you discuss any questions you have with your health care provider. ° °Sciatica °with Rehab °The sciatic nerve runs from the back down the leg and is responsible for sensation and control of the muscles in the back (posterior) side of the thigh, lower leg, and foot. Sciatica is a condition that is characterized by inflammation of this nerve.  °SYMPTOMS  °· Signs of nerve damage, including numbness and/or weakness along the posterior side of the lower extremity. °· Pain in the back of the thigh that may also travel down the leg. °· Pain that worsens when sitting for long periods of time. °· Occasionally, pain in the back or buttock. °CAUSES  °Inflammation of the sciatic nerve is the cause of sciatica. The inflammation is due to something irritating the nerve. Common sources of irritation include: °· Sitting for long periods of time. °· Direct trauma to the nerve. °· Arthritis of the spine. °· Herniated or ruptured disk. °· Slipping of the vertebrae (spondylolisthesis). °· Pressure from soft tissues, such as muscles or ligament-like tissue (fascia). °RISK INCREASES WITH: °· Sports that place pressure or stress on the spine (football or weightlifting). °· Poor strength and flexibility. °· Failure to warm up properly before activity. °· Family history of low back pain or disk disorders. °· Previous back injury or surgery. °· Poor body mechanics, especially when lifting, or poor posture. °PREVENTION  °· Warm up and stretch properly before activity. °· Maintain physical fitness: °¨ Strength, flexibility, and endurance. °· Cardiovascular fitness. °· Learn and use proper  technique, especially with posture and lifting. When possible, have coach correct improper technique. °· Avoid activities that place stress on the spine. °PROGNOSIS °If treated properly, then sciatica usually resolves within 6 weeks. However, occasionally surgery is necessary.  °RELATED COMPLICATIONS  °· Permanent nerve damage, including pain, numbness, tingle, or weakness. °· Chronic back pain. °· Risks of surgery: infection, bleeding, nerve damage, or damage to surrounding tissues. °TREATMENT °Treatment initially involves resting from any activities that aggravate your symptoms. The use of ice and medication may help reduce pain and inflammation. The use of strengthening and stretching exercises may help reduce pain with activity. These exercises may be performed at home or with referral to a therapist. A therapist may recommend further treatments, such as transcutaneous electronic nerve stimulation (TENS) or ultrasound. Your caregiver may recommend corticosteroid injections to help reduce inflammation of the sciatic nerve. If symptoms persist despite non-surgical (conservative) treatment, then surgery may be recommended. °MEDICATION °· If pain medication is necessary, then nonsteroidal anti-inflammatory medications, such as aspirin and ibuprofen, or other minor pain relievers, such as acetaminophen, are often recommended. °· Do not take pain medication for 7 days before surgery. °· Prescription pain relievers may be given if deemed necessary by your caregiver.   Use only as directed and only as much as you need. °· Ointments applied to the skin may be helpful. °· Corticosteroid injections may be given by your caregiver. These injections should be reserved for the most serious cases, because they may only be given a certain number of times. °HEAT AND COLD °· Cold treatment (icing) relieves pain and reduces inflammation. Cold treatment should be applied for 10 to 15 minutes every 2 to 3 hours for inflammation and  pain and immediately after any activity that aggravates your symptoms. Use ice packs or massage the area with a piece of ice (ice massage). °· Heat treatment may be used prior to performing the stretching and strengthening activities prescribed by your caregiver, physical therapist, or athletic trainer. Use a heat pack or soak the injury in warm water. °SEEK MEDICAL CARE IF: °· Treatment seems to offer no benefit, or the condition worsens. °· Any medications produce adverse side effects. °EXERCISES  °RANGE OF MOTION (ROM) AND STRETCHING EXERCISES - Sciatica °Most people with sciatic will find that their symptoms worsen with either excessive bending forward (flexion) or arching at the low back (extension). The exercises which will help resolve your symptoms will focus on the opposite motion. Your physician, physical therapist or athletic trainer will help you determine which exercises will be most helpful to resolve your low back pain. Do not complete any exercises without first consulting with your clinician. Discontinue any exercises which worsen your symptoms until you speak to your clinician. If you have pain, numbness or tingling which travels down into your buttocks, leg or foot, the goal of the therapy is for these symptoms to move closer to your back and eventually resolve. Occasionally, these leg symptoms will get better, but your low back pain may worsen; this is typically an indication of progress in your rehabilitation. Be certain to be very alert to any changes in your symptoms and the activities in which you participated in the 24 hours prior to the change. Sharing this information with your clinician will allow him/her to most efficiently treat your condition. °These exercises may help you when beginning to rehabilitate your injury. Your symptoms may resolve with or without further involvement from your physician, physical therapist or athletic trainer. While completing these exercises, remember:   °· Restoring tissue flexibility helps normal motion to return to the joints. This allows healthier, less painful movement and activity. °· An effective stretch should be held for at least 30 seconds. °· A stretch should never be painful. You should only feel a gentle lengthening or release in the stretched tissue. °FLEXION RANGE OF MOTION AND STRETCHING EXERCISES: °STRETCH - Flexion, Single Knee to Chest  °· Lie on a firm bed or floor with both legs extended in front of you. °· Keeping one leg in contact with the floor, bring your opposite knee to your chest. Hold your leg in place by either grabbing behind your thigh or at your knee. °· Pull until you feel a gentle stretch in your low back. Hold __________ seconds. °· Slowly release your grasp and repeat the exercise with the opposite side. °Repeat __________ times. Complete this exercise __________ times per day.  °STRETCH - Flexion, Double Knee to Chest °· Lie on a firm bed or floor with both legs extended in front of you. °· Keeping one leg in contact with the floor, bring your opposite knee to your chest. °· Tense your stomach muscles to support your back and then lift your other knee to your   chest. Hold your legs in place by either grabbing behind your thighs or at your knees. °· Pull both knees toward your chest until you feel a gentle stretch in your low back. Hold __________ seconds. °· Tense your stomach muscles and slowly return one leg at a time to the floor. °Repeat __________ times. Complete this exercise __________ times per day.  °STRETCH - Low Trunk Rotation  °· Lie on a firm bed or floor. Keeping your legs in front of you, bend your knees so they are both pointed toward the ceiling and your feet are flat on the floor. °· Extend your arms out to the side. This will stabilize your upper body by keeping your shoulders in contact with the floor. °· Gently and slowly drop both knees together to one side until you feel a gentle stretch in your low back.  Hold for __________ seconds. °· Tense your stomach muscles to support your low back as you bring your knees back to the starting position. Repeat the exercise to the other side. °Repeat __________ times. Complete this exercise __________ times per day  °EXTENSION RANGE OF MOTION AND FLEXIBILITY EXERCISES: °STRETCH - Extension, Prone on Elbows °· Lie on your stomach on the floor, a bed will be too soft. Place your palms about shoulder width apart and at the height of your head. °· Place your elbows under your shoulders. If this is too painful, stack pillows under your chest. °· Allow your body to relax so that your hips drop lower and make contact more completely with the floor. °· Hold this position for __________ seconds. °· Slowly return to lying flat on the floor. °Repeat __________ times. Complete this exercise __________ times per day.  °RANGE OF MOTION - Extension, Prone Press Ups °· Lie on your stomach on the floor, a bed will be too soft. Place your palms about shoulder width apart and at the height of your head. °· Keeping your back as relaxed as possible, slowly straighten your elbows while keeping your hips on the floor. You may adjust the placement of your hands to maximize your comfort. As you gain motion, your hands will come more underneath your shoulders. °· Hold this position __________ seconds. °· Slowly return to lying flat on the floor. °Repeat __________ times. Complete this exercise __________ times per day.  °STRENGTHENING EXERCISES - Sciatica  °These exercises may help you when beginning to rehabilitate your injury. These exercises should be done near your "sweet spot." This is the neutral, low-back arch, somewhere between fully rounded and fully arched, that is your least painful position. When performed in this safe range of motion, these exercises can be used for people who have either a flexion or extension based injury. These exercises may resolve your symptoms with or without further  involvement from your physician, physical therapist or athletic trainer. While completing these exercises, remember:  °· Muscles can gain both the endurance and the strength needed for everyday activities through controlled exercises. °· Complete these exercises as instructed by your physician, physical therapist or athletic trainer. Progress with the resistance and repetition exercises only as your caregiver advises. °· You may experience muscle soreness or fatigue, but the pain or discomfort you are trying to eliminate should never worsen during these exercises. If this pain does worsen, stop and make certain you are following the directions exactly. If the pain is still present after adjustments, discontinue the exercise until you can discuss the trouble with your clinician. °STRENGTHENING - Deep Abdominals,   Pelvic Tilt  °· Lie on a firm bed or floor. Keeping your legs in front of you, bend your knees so they are both pointed toward the ceiling and your feet are flat on the floor. °· Tense your lower abdominal muscles to press your low back into the floor. This motion will rotate your pelvis so that your tail bone is scooping upwards rather than pointing at your feet or into the floor. °· With a gentle tension and even breathing, hold this position for __________ seconds. °Repeat __________ times. Complete this exercise __________ times per day.  °STRENGTHENING - Abdominals, Crunches  °· Lie on a firm bed or floor. Keeping your legs in front of you, bend your knees so they are both pointed toward the ceiling and your feet are flat on the floor. Cross your arms over your chest. °· Slightly tip your chin down without bending your neck. °· Tense your abdominals and slowly lift your trunk high enough to just clear your shoulder blades. Lifting higher can put excessive stress on the low back and does not further strengthen your abdominal muscles. °· Control your return to the starting position. °Repeat __________  times. Complete this exercise __________ times per day.  °STRENGTHENING - Quadruped, Opposite UE/LE Lift °· Assume a hands and knees position on a firm surface. Keep your hands under your shoulders and your knees under your hips. You may place padding under your knees for comfort. °· Find your neutral spine and gently tense your abdominal muscles so that you can maintain this position. Your shoulders and hips should form a rectangle that is parallel with the floor and is not twisted. °· Keeping your trunk steady, lift your right hand no higher than your shoulder and then your left leg no higher than your hip. Make sure you are not holding your breath. Hold this position __________ seconds. °· Continuing to keep your abdominal muscles tense and your back steady, slowly return to your starting position. Repeat with the opposite arm and leg. °Repeat __________ times. Complete this exercise __________ times per day.  °STRENGTHENING - Abdominals and Quadriceps, Straight Leg Raise  °· Lie on a firm bed or floor with both legs extended in front of you. °· Keeping one leg in contact with the floor, bend the other knee so that your foot can rest flat on the floor. °· Find your neutral spine, and tense your abdominal muscles to maintain your spinal position throughout the exercise. °· Slowly lift your straight leg off the floor about 6 inches for a count of 15, making sure to not hold your breath. °· Still keeping your neutral spine, slowly lower your leg all the way to the floor. °Repeat this exercise with each leg __________ times. Complete this exercise __________ times per day. °POSTURE AND BODY MECHANICS CONSIDERATIONS - Sciatica °Keeping correct posture when sitting, standing or completing your activities will reduce the stress put on different body tissues, allowing injured tissues a chance to heal and limiting painful experiences. The following are general guidelines for improved posture. Your physician or physical  therapist will provide you with any instructions specific to your needs. While reading these guidelines, remember: °· The exercises prescribed by your provider will help you have the flexibility and strength to maintain correct postures. °· The correct posture provides the optimal environment for your joints to work. All of your joints have less wear and tear when properly supported by a spine with good posture. This means you will experience a   healthier, less painful body. °· Correct posture must be practiced with all of your activities, especially prolonged sitting and standing. Correct posture is as important when doing repetitive low-stress activities (typing) as it is when doing a single heavy-load activity (lifting). °RESTING POSITIONS °Consider which positions are most painful for you when choosing a resting position. If you have pain with flexion-based activities (sitting, bending, stooping, squatting), choose a position that allows you to rest in a less flexed posture. You would want to avoid curling into a fetal position on your side. If your pain worsens with extension-based activities (prolonged standing, working overhead), avoid resting in an extended position such as sleeping on your stomach. Most people will find more comfort when they rest with their spine in a more neutral position, neither too rounded nor too arched. Lying on a non-sagging bed on your side with a pillow between your knees, or on your back with a pillow under your knees will often provide some relief. Keep in mind, being in any one position for a prolonged period of time, no matter how correct your posture, can still lead to stiffness. °PROPER SITTING POSTURE °In order to minimize stress and discomfort on your spine, you must sit with correct posture Sitting with good posture should be effortless for a healthy body. Returning to good posture is a gradual process. Many people can work toward this most comfortably by using various  supports until they have the flexibility and strength to maintain this posture on their own. °When sitting with proper posture, your ears will fall over your shoulders and your shoulders will fall over your hips. You should use the back of the chair to support your upper back. Your low back will be in a neutral position, just slightly arched. You may place a small pillow or folded towel at the base of your low back for support.  °When working at a desk, create an environment that supports good, upright posture. Without extra support, muscles fatigue and lead to excessive strain on joints and other tissues. Keep these recommendations in mind: °CHAIR:  °· A chair should be able to slide under your desk when your back makes contact with the back of the chair. This allows you to work closely. °· The chair's height should allow your eyes to be level with the upper part of your monitor and your hands to be slightly lower than your elbows. °BODY POSITION °· Your feet should make contact with the floor. If this is not possible, use a foot rest. °· Keep your ears over your shoulders. This will reduce stress on your neck and low back. °INCORRECT SITTING POSTURES  °· If you are feeling tired and unable to assume a healthy sitting posture, do not slouch or slump. This puts excessive strain on your back tissues, causing more damage and pain. Healthier options include: °· Using more support, like a lumbar pillow. °· Switching tasks to something that requires you to be upright or walking. °· Talking a brief walk. °· Lying down to rest in a neutral-spine position. °PROLONGED STANDING WHILE SLIGHTLY LEANING FORWARD  °When completing a task that requires you to lean forward while standing in one place for a long time, place either foot up on a stationary 2-4 inch high object to help maintain the best posture. When both feet are on the ground, the low back tends to lose its slight inward curve. If this curve flattens (or becomes too  large), then the back and your other joints   will experience too much stress, fatigue more quickly and can cause pain.  °CORRECT STANDING POSTURES °Proper standing posture should be assumed with all daily activities, even if they only take a few moments, like when brushing your teeth. As in sitting, your ears should fall over your shoulders and your shoulders should fall over your hips. You should keep a slight tension in your abdominal muscles to brace your spine. Your tailbone should point down to the ground, not behind your body, resulting in an over-extended swayback posture.  °INCORRECT STANDING POSTURES  °Common incorrect standing postures include a forward head, locked knees and/or an excessive swayback. °WALKING °Walk with an upright posture. Your ears, shoulders and hips should all line-up. °PROLONGED ACTIVITY IN A FLEXED POSITION °When completing a task that requires you to bend forward at your waist or lean over a low surface, try to find a way to stabilize 3 of 4 of your limbs. You can place a hand or elbow on your thigh or rest a knee on the surface you are reaching across. This will provide you more stability so that your muscles do not fatigue as quickly. By keeping your knees relaxed, or slightly bent, you will also reduce stress across your low back. °CORRECT LIFTING TECHNIQUES °DO :  °· Assume a wide stance. This will provide you more stability and the opportunity to get as close as possible to the object which you are lifting. °· Tense your abdominals to brace your spine; then bend at the knees and hips. Keeping your back locked in a neutral-spine position, lift using your leg muscles. Lift with your legs, keeping your back straight. °· Test the weight of unknown objects before attempting to lift them. °· Try to keep your elbows locked down at your sides in order get the best strength from your shoulders when carrying an object. °· Always ask for help when lifting heavy or awkward  objects. °INCORRECT LIFTING TECHNIQUES °DO NOT:  °· Lock your knees when lifting, even if it is a small object. °· Bend and twist. Pivot at your feet or move your feet when needing to change directions. °· Assume that you cannot safely pick up a paperclip without proper posture. °Document Released: 09/18/2005 Document Revised: 02/02/2014 Document Reviewed: 12/31/2008 °[employer]® Patient Information ©2015 [employer]. This information is not intended to replace advice given to you by your health care provider. Make sure you discuss any questions you have with your health care provider. ° °

## 2014-06-14 NOTE — ED Provider Notes (Signed)
Medical screening examination/treatment/procedure(s) were performed by non-physician practitioner and as supervising physician I was immediately available for consultation/collaboration.   EKG Interpretation None       Juliet Rude. Rubin Payor, MD 06/14/14 2312

## 2016-07-07 ENCOUNTER — Emergency Department (HOSPITAL_COMMUNITY)
Admission: EM | Admit: 2016-07-07 | Discharge: 2016-07-07 | Disposition: A | Discharge status: Home / Self Care | Payer: Medicaid Other | Attending: Emergency Medicine | Admitting: Emergency Medicine

## 2016-07-07 ENCOUNTER — Encounter (HOSPITAL_COMMUNITY): Payer: Self-pay | Admitting: Emergency Medicine

## 2016-07-07 DIAGNOSIS — Z23 Encounter for immunization: Secondary | ICD-10-CM | POA: Insufficient documentation

## 2016-07-07 DIAGNOSIS — L237 Allergic contact dermatitis due to plants, except food: Secondary | ICD-10-CM | POA: Insufficient documentation

## 2016-07-07 DIAGNOSIS — L255 Unspecified contact dermatitis due to plants, except food: Secondary | ICD-10-CM

## 2016-07-07 MED ORDER — TETANUS-DIPHTH-ACELL PERTUSSIS 5-2.5-18.5 LF-MCG/0.5 IM SUSP
0.5000 mL | Freq: Once | INTRAMUSCULAR | Status: AC
  Administered 2016-07-07: 0.5 mL via INTRAMUSCULAR
  Filled 2016-07-07: qty 0.5

## 2016-07-07 MED ORDER — PREDNISONE 20 MG PO TABS
60.0000 mg | ORAL_TABLET | Freq: Once | ORAL | Status: AC
  Administered 2016-07-07: 60 mg via ORAL
  Filled 2016-07-07: qty 3

## 2016-07-07 MED ORDER — PREDNISONE 20 MG PO TABS: 60.0000 mg | ORAL_TABLET | Freq: Every day | ORAL | 0 refills | Status: DC

## 2016-07-07 NOTE — ED Triage Notes (Signed)
Pt presents to ED for rash developed x 2 days to his left arm and back.  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  Unknown, but believed to be poison ivy.

## 2016-07-07 NOTE — ED Notes (Addendum)
Itching rash on arms x 2 days. States was working outside.

## 2016-07-07 NOTE — ED Provider Notes (Signed)
[facility]-EMERGENCY DEPT Provider Note   CSN: [id number] Arrival date & time: 07/07/16  1233   By signing my name below, I, Freida Busmaniana Omoyeni, attest that this documentation has been prepared under the direction and in the presence of non-physician practitioner, Wynetta EmeryNicole Seidy Labreck, PA-C. Electronically Signed: Freida Busmaniana Omoyeni, Scribe. 07/07/2016. 1:08 PM.   History   Chief Complaint Chief Complaint  Patient presents with  . Rash     The history is provided by the patient. No language interpreter was used.     HPI Comments:  Bearl W Noreene LarssonYac Quiem is a 35 y.o. male who presents to the Emergency Department complaining of a pruritic rash on his  BUE and back x 3 days. 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 No alleviating factors noted.  He denies h/o DM. No fever, SOB, or difficulty swallowing. Pt has no other acute complaints or symptoms at this time.    History reviewed. No pertinent past medical history.  There are no active problems to display for this patient.   History reviewed. No pertinent surgical history.     Home Medications    Prior to Admission medications   Medication Sig Start Date End Date Taking? Authorizing Provider  acetaminophen (TYLENOL) 325 MG tablet Take 650-975 mg by mouth every 6 (six) hours as needed for moderate pain.    Historical Provider, MD  ibuprofen (ADVIL,MOTRIN) 200 MG tablet Take 1,200 mg by mouth every 6 (six) hours as needed for moderate pain.    Historical Provider, MD  meloxicam (MOBIC) 15 MG tablet Take 1 tablet (15 mg total) by mouth daily. 06/09/14   Arthor CaptainAbigail Harris, PA-C  oxyCODONE-acetaminophen (PERCOCET) 10-325 MG per tablet Take 1 tablet by mouth every 4 (four) hours as needed for pain. 06/09/14   Arthor CaptainAbigail Harris, PA-C  predniSONE (DELTASONE) 20 MG tablet Take 3 tablets (60 mg total) by mouth daily. Take 60 mg by mouth daily week 1, then 40mg  by mouth daily for week 2, then 20mg  daily for week 3 07/07/16   Wynetta EmeryNicole Adalin Vanderploeg,  PA-C    Family History History reviewed. No pertinent family history.  Social History Social History  Substance Use Topics  . Smoking status: Never Smoker  . Smokeless tobacco: Never Used  . Alcohol use Yes     Comment: approx 100oz per day of beer.     Allergies   Anesthetics, amide   Review of Systems Review of Systems  Constitutional: Negative for fever.  HENT: Negative for trouble swallowing.   Respiratory: Negative for cough and shortness of breath.   Skin: Positive for rash.     Physical Exam Updated Vital Signs BP 156/93 (BP Location: Right Arm)   Pulse 94   Temp 98.1 F (36.7 C) (Oral)   Resp 18   SpO2 96%   Physical Exam  Constitutional: He is oriented to person, place, and time. He appears well-developed and well-nourished. No distress.  HENT:  Head: Normocephalic and atraumatic.  Eyes: Conjunctivae are normal.  Cardiovascular: Normal rate.   Pulmonary/Chest: Effort normal.  Abdominal: He exhibits no distension.  Neurological: He is alert and oriented to person, place, and time.  Skin: Skin is warm and dry.  Vesicular lesions to bilateral upper extremities and right thorax, no warmth, tenderness to palpation or purulent discharge, no surrounding cellulitis. Lesions are blanchable, the spare the palms soles and mucous membranes.  Psychiatric: He has a normal mood and affect.  Nursing note and vitals reviewed.  ED Treatments / Results  DIAGNOSTIC STUDIES:  Oxygen Saturation is 96% on RA, noirmal by my interpretation.    COORDINATION OF CARE:  1:08 PM Discussed treatment plan with pt at bedside and pt agreed to plan.  Labs (all labs ordered are listed, but only abnormal results are displayed) Labs Reviewed - No data to display  EKG  EKG Interpretation None       Radiology No results found.  Procedures Procedures (including critical care time)  Medications Ordered in ED Medications  Tdap (BOOSTRIX) injection 0.5 mL (0.5 mLs  Intramuscular Given 07/07/16 1321)  predniSONE (DELTASONE) tablet 60 mg (60 mg Oral Given 07/07/16 1320)     Initial Impression / Assessment and Plan / ED Course  I have reviewed the triage vital signs and the nursing notes.  Pertinent labs & imaging results that were available during my care of the patient were reviewed by me and considered in my medical decision making (see chart for details).  Clinical Course   Vitals:   07/07/16 1237  BP: 156/93  Pulse: 94  Resp: 18  Temp: 98.1 F (36.7 C)  TempSrc: Oral  SpO2: 96%    Medications  Tdap (BOOSTRIX) injection 0.5 mL (0.5 mLs Intramuscular Given 07/07/16 1321)  predniSONE (DELTASONE) tablet 60 mg (60 mg Oral Given 07/07/16 1320)    Sonam W Kashis Penley is 35 y.o. male presenting with Painful and pruritic lesion to bilateral upper arms and chest consistent with poison ivy. Tetanus is updated and patient started on a prednisone taper, counseled him to use calamine lotion and Benadryl  Evaluation does not show pathology that would require ongoing emergent intervention or inpatient treatment. Pt is hemodynamically stable and mentating appropriately. Discussed findings and plan with patient/guardian, who agrees with care plan. All questions answered. Return precautions discussed and outpatient follow up given.    Pt presentation consistant with poison ivy infection. Discussed contagiousness & home care. Script for PO prednisone taper (BAD= x 3 wks 90, 40, 20 x 3 wks, mod= 14 -21 days) given and pt advised to use calamine lotion.  Pt afebrile and in NAD prior to discharge. Airway intact without compromise. No facial or genital involvement of rash.   Final Clinical Impressions(s) / ED Diagnoses   Final diagnoses:  Contact dermatitis due to poison vine    New Prescriptions New Prescriptions   PREDNISONE (DELTASONE) 20 MG TABLET    Take 3 tablets (60 mg total) by mouth daily. Take 60 mg by mouth daily week 1, then 40mg  by mouth daily for  week 2, then 20mg  daily for week 3   I personally performed the services described in this documentation, which was scribed in my presence. The recorded information has been reviewed and is accurate.    Wynetta Emery, PA-C 07/07/16 1328    Lyndal Pulley, MD 07/08/16 1036

## 2016-07-07 NOTE — Discharge Instructions (Signed)
Please follow with your primary care doctor in the next 2 days for a check-up. They must obtain records for further management.  ° °Do not hesitate to return to the Emergency Department for any new, worsening or concerning symptoms.  ° °

## 2016-08-23 ENCOUNTER — Encounter (HOSPITAL_COMMUNITY): Payer: Self-pay | Admitting: Emergency Medicine

## 2016-08-23 ENCOUNTER — Emergency Department (HOSPITAL_COMMUNITY)
Admission: EM | Admit: 2016-08-23 | Discharge: 2016-08-24 | Disposition: A | Discharge status: Home / Self Care | Payer: Medicaid Other | Attending: Emergency Medicine | Admitting: Emergency Medicine

## 2016-08-23 DIAGNOSIS — J069 Acute upper respiratory infection, unspecified: Secondary | ICD-10-CM | POA: Insufficient documentation

## 2016-08-23 DIAGNOSIS — Z79899 Other long term (current) drug therapy: Secondary | ICD-10-CM | POA: Insufficient documentation

## 2016-08-23 LAB — RAPID STREP SCREEN (MED CTR MEBANE ONLY): Streptococcus, Group A Screen (Direct): NEGATIVE

## 2016-08-23 MED ORDER — ACETAMINOPHEN 325 MG PO TABS
650.0000 mg | ORAL_TABLET | Freq: Once | ORAL | Status: AC | PRN
  Administered 2016-08-23: 650 mg via ORAL
  Filled 2016-08-23: qty 2

## 2016-08-23 NOTE — ED Triage Notes (Signed)
Patient complaining of cough, nasal congestion, and sore throat x 1 day. Patient states he is blowing yellow/green snot. Patient states that his body is sore when he gets up in the am and has a headache. Patient is complaining of headache now.

## 2016-08-24 NOTE — ED Notes (Signed)
Patient refused vital signs due to he had to go.

## 2016-08-24 NOTE — Discharge Instructions (Signed)
Start taking Naprosyn twice daily. Continue using your nasal spray. He may also start taking Zyrtec once daily. Follow-up with your primary care physician. Return to the emergency department for worsening cough, fever, severe chest pain, shortness of breath, or any new or concerning symptoms.

## 2016-08-24 NOTE — ED Provider Notes (Signed)
[facility]-EMERGENCY DEPT Provider Note   CSN: [id number] Arrival date & time: 08/23/16  2214     History   Chief Complaint Chief Complaint  Patient presents with  . Sore Throat  . Cough  . Nasal Congestion    HPI Brian Kim is a 35 y.o. male.  HPI Patient presents with 2-3 day history gradual onset, constant, unchanging of sore throat, dry cough, nasal congestion, frontal headache. No fever, chills, visual disturbances, neck stiffness, myalgias, chest pain, shortness of breath, nausea, vomiting, diarrhea, or abdominal pain. She has not tried anything for his symptoms. Nothing makes his symptoms better or worse. No sick contacts.  History reviewed. No pertinent past medical history.  There are no active problems to display for this patient.   History reviewed. No pertinent surgical history.     Home Medications    Prior to Admission medications   Medication Sig Start Date End Date Taking? Authorizing Provider  acetaminophen (TYLENOL) 325 MG tablet Take 650-975 mg by mouth every 6 (six) hours as needed for moderate pain.    Historical Provider, MD  ibuprofen (ADVIL,MOTRIN) 200 MG tablet Take 1,200 mg by mouth every 6 (six) hours as needed for moderate pain.    Historical Provider, MD  meloxicam (MOBIC) 15 MG tablet Take 1 tablet (15 mg total) by mouth daily. 06/09/14   Arthor CaptainAbigail Harris, PA-C  oxyCODONE-acetaminophen (PERCOCET) 10-325 MG per tablet Take 1 tablet by mouth every 4 (four) hours as needed for pain. 06/09/14   Arthor CaptainAbigail Harris, PA-C  predniSONE (DELTASONE) 20 MG tablet Take 3 tablets (60 mg total) by mouth daily. Take 60 mg by mouth daily week 1, then 40mg  by mouth daily for week 2, then 20mg  daily for week 3 07/07/16   Wynetta EmeryNicole Pisciotta, PA-C    Family History History reviewed. No pertinent family history.  Social History Social History  Substance Use Topics  . Smoking status: Never Smoker  . Smokeless tobacco: Never Used  . Alcohol use Yes     Comment: approx  100oz per day of beer.     Allergies   Anesthetics, amide   Review of Systems Review of Systems All other systems negative unless otherwise stated in HPI   Physical Exam Updated Vital Signs BP 111/68 (BP Location: Left Arm)   Pulse 83   Temp 97.8 F (36.6 C) (Oral)   Resp 18   Ht 5\' 8"  (1.727 m)   Wt 88.5 kg   SpO2 98%   BMI 29.65 kg/m   Physical Exam  Constitutional: He is oriented to person, place, and time. He appears well-developed and well-nourished. He is active.  Non-toxic appearance. He does not have a sickly appearance. He does not appear ill.  HENT:  Head: Normocephalic and atraumatic.  Right Ear: Tympanic membrane and external ear normal. Tympanic membrane is not erythematous and not bulging.  Left Ear: Tympanic membrane and external ear normal. Tympanic membrane is not erythematous and not bulging.  Nose: Nose normal.  Mouth/Throat: Uvula is midline, oropharynx is clear and moist and mucous membranes are normal. No trismus in the jaw. No uvula swelling. No oropharyngeal exudate, posterior oropharyngeal edema, posterior oropharyngeal erythema or tonsillar abscesses.  Neck: Normal range of motion. Neck supple.  No nuchal rigidity.   Cardiovascular: Normal rate and regular rhythm.   Pulmonary/Chest: Effort normal and breath sounds normal. No respiratory distress. He has no wheezes. He has no rales.  Abdominal: Soft. Bowel sounds are normal. He exhibits no distension. There is  no tenderness.  Musculoskeletal: Normal range of motion.  Lymphadenopathy:    He has no cervical adenopathy.  Neurological: He is alert and oriented to person, place, and time.  Skin: Skin is warm and dry.  Psychiatric: He has a normal mood and affect. His behavior is normal.     ED Treatments / Results  Labs (all labs ordered are listed, but only abnormal results are displayed) Labs Reviewed  RAPID [facility]RE[facility]CREEN (NOT [facility])  CULTURE, GROUP A STREP [facility])    EKG  EKG  Interpretation None       Radiology No results found.  Procedures Procedures (including critical care time)  Medications Ordered in ED Medications  acetaminophen (TYLENOL) tablet 650 mg (650 mg Oral Given 08/23/16 2249)     Initial Impression / Assessment and Plan / ED Course  I have reviewed the triage vital signs and the nursing notes.  Pertinent labs & imaging results that were available during my care of the patient were reviewed by me and considered in my medical decision making (see chart for details).  Clinical Course    Pt symptoms consistent with URI. Lungs clear to auscultation. Vitals reassuring. Low suspicion for pneumonia, no indication for chest x-ray at this time. Rapid strep negative. Pt will be discharged with symptomatic treatment.  Discussed return precautions.  Pt is hemodynamically stable & in NAD prior to discharge.   Final Clinical Impressions(s) / ED Diagnoses   Final diagnoses:  Upper respiratory tract infection, unspecified type    New Prescriptions Discharge Medication List as of 08/24/2016 12:09 AM       Cheri FowlerKayla Dreyton Roessner, PA-C 08/24/16 [id number]    Doug SouSam Jacubowitz, MD 08/24/16 [id number]

## 2016-08-26 LAB — CULTURE, GROUP A STREP (THRC)

## 2016-12-05 ENCOUNTER — Encounter (HOSPITAL_COMMUNITY): Payer: Self-pay

## 2016-12-05 ENCOUNTER — Emergency Department (HOSPITAL_COMMUNITY)
Admission: EM | Admit: 2016-12-05 | Discharge: 2016-12-05 | Disposition: A | Discharge status: Home / Self Care | Payer: Medicaid Other | Attending: Emergency Medicine | Admitting: Emergency Medicine

## 2016-12-05 DIAGNOSIS — W500XXA Accidental hit or strike by another person, initial encounter: Secondary | ICD-10-CM | POA: Insufficient documentation

## 2016-12-05 DIAGNOSIS — Y990 Civilian activity done for income or pay: Secondary | ICD-10-CM | POA: Insufficient documentation

## 2016-12-05 DIAGNOSIS — M545 Low back pain, unspecified: Secondary | ICD-10-CM

## 2016-12-05 DIAGNOSIS — Y9389 Activity, other specified: Secondary | ICD-10-CM | POA: Insufficient documentation

## 2016-12-05 DIAGNOSIS — Y929 Unspecified place or not applicable: Secondary | ICD-10-CM | POA: Insufficient documentation

## 2016-12-05 MED ORDER — METHOCARBAMOL 500 MG PO TABS: 500.0000 mg | ORAL_TABLET | Freq: Two times a day (BID) | ORAL | 0 refills | Status: DC | PRN

## 2016-12-05 MED ORDER — NAPROXEN 500 MG PO TABS: 500.0000 mg | ORAL_TABLET | Freq: Two times a day (BID) | ORAL | 0 refills | Status: DC

## 2016-12-05 NOTE — ED Notes (Signed)
Called name, but no response from waiting room. 

## 2016-12-05 NOTE — ED Triage Notes (Signed)
States at work and got bumped yesterday and now lower back pain worse with movement.  No fall voiced. No bowel or bladder problems.

## 2016-12-05 NOTE — ED Provider Notes (Signed)
[facility]-EMERGENCY DEPT Provider Note   CSN: [id number] Arrival date & time: 12/05/16  1858  By signing my name below, I, Orpah CobbMaurice Copeland, attest that this documentation has been prepared under the direction and in the presence of Everlene FarrierWilliam Fadil Macmaster, PA-C. Electronically Signed: Orpah CobbMaurice Copeland , ED Scribe. 12/05/16. 8:15 PM.     History   Chief Complaint Chief Complaint  Patient presents with  . Back Pain    HPI Brian Kim is a 36 y.o. male who presents to the Emergency Department complaining of mild to moderate lower back pain with gradual onset x1 day. 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 Pt reportedly fell backwards onto the side railing hitting his lower back. He denies falling to the ground. Pt awoke this morning with gradual onset of right lower back pain.  He has taken Percocet x4 hours ago with mild relief. He reports the pain being exacerbated with movement. Pt denies fevers, nausea, vomiting, diarrhea, chest pain, SOB, fever, Dysuria, hematuria, weakness, numbness, tingling, bladder/bowel incontinence.    The history is provided by the patient. No language interpreter was used.    History reviewed. No pertinent past medical history.  There are no active problems to display for this patient.   History reviewed. No pertinent surgical history.     Home Medications    Prior to Admission medications   Medication Sig Start Date End Date Taking? Authorizing Provider  acetaminophen (TYLENOL) 325 MG tablet Take 650-975 mg by mouth every 6 (six) hours as needed for moderate pain.    Historical Provider, MD  methocarbamol (ROBAXIN) 500 MG tablet Take 1 tablet (500 mg total) by mouth 2 (two) times daily as needed for muscle spasms. 12/05/16   Everlene FarrierWilliam Azariya Freeman, PA-C  naproxen (NAPROSYN) 500 MG tablet Take 1 tablet (500 mg total) by mouth 2 (two) times daily with a meal. 12/05/16   Everlene FarrierWilliam Kiondra Caicedo, PA-C    Family  History History reviewed. No pertinent family history.  Social History Social History  Substance Use Topics  . Smoking status: Never Smoker  . Smokeless tobacco: Never Used  . Alcohol use Yes     Comment: approx 100oz per day of beer.     Allergies   Anesthetics, amide   Review of Systems Review of Systems  Constitutional: Negative for fever.  Respiratory: Negative for shortness of breath.   Cardiovascular: Negative for chest pain.  Gastrointestinal: Negative for abdominal pain, diarrhea, nausea and vomiting.  Genitourinary: Negative for difficulty urinating, dysuria, frequency, hematuria and urgency.  Musculoskeletal: Positive for back pain.  Skin: Negative for rash.  Neurological: Negative for weakness and numbness.     Physical Exam Updated Vital Signs BP 160/98 (BP Location: Left Arm)   Pulse 92   Temp 97.8 F (36.6 C) (Oral)   Resp 18   Ht 5\' 8"  (1.727 m)   Wt 88.5 kg   SpO2 98%   BMI 29.65 kg/m   Physical Exam  Constitutional: He appears well-developed and well-nourished. No distress.  Nontoxic appearing.  HENT:  Head: Normocephalic and atraumatic.  Eyes: Conjunctivae are normal. Pupils are equal, round, and reactive to light. Right eye exhibits no discharge. Left eye exhibits no discharge.  Neck: Neck supple.  Cardiovascular: Normal rate, regular rhythm, normal heart sounds and intact distal pulses.   Pulmonary/Chest: Effort normal and breath sounds normal. No respiratory distress. He has no wheezes. He has no rales.  lungs  clear to auscultation bilaterally, symmetric chest expansion bilaterally  Abdominal: Soft. There is no tenderness. There is no guarding.  Abdomen is soft and nontender to palpation.  Musculoskeletal: Normal range of motion. He exhibits tenderness. He exhibits no edema or deformity.  Tenderness along the R lateral back musculature. No midline neck or back tenderness. No back erythema, ecchymosis or deformity. Good strength in his  bilateral lower extremities.  Lymphadenopathy:    He has no cervical adenopathy.  Neurological: He is alert. He displays normal reflexes. No sensory deficit. Coordination normal.  Bilateral patellar DTRs are intact, sensation intact to bilateral lower extremities. Normal gait.  Skin: Skin is warm and dry. Capillary refill takes less than 2 seconds. No rash noted. He is not diaphoretic. No erythema. No pallor.  Psychiatric: He has a normal mood and affect. His behavior is normal.  Nursing note and vitals reviewed.    ED Treatments / Results   DIAGNOSTIC STUDIES: Oxygen Saturation is 98% on RA, normal by my interpretation.   COORDINATION OF CARE: 8:15 PM-Discussed next steps with pt. Pt verbalized understanding and is agreeable with the plan.    Labs (all labs ordered are listed, but only abnormal results are displayed) Labs Reviewed - No data to display  EKG  EKG Interpretation None       Radiology No results found.  Procedures Procedures (including critical care time)  Medications Ordered in ED Medications - No data to display   Initial Impression / Assessment and Plan / ED Course  I have reviewed the triage vital signs and the nursing notes.  Pertinent labs & imaging results that were available during my care of the patient were reviewed by me and considered in my medical decision making (see chart for details).    This is a 36 y.o. male who presents to the Emergency Department complaining of mild to moderate lower back pain with gradual onset x1 day. 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 Pt reportedly fell backwards onto the side railing hitting his lower back. He denies falling to the ground. Pt awoke this morning with gradual onset of right lower back pain.  He has taken Percocet x4 hours ago with mild relief. He reports the pain being exacerbated with movement.  Patient with back pain.  No  neurological deficits and normal neuro exam.  Patient can walk but states is painful. Normal gait.  No loss of bowel or bladder control.  No concern for cauda equina.  No fever, night sweats, weight loss, h/o cancer, IVDU.  RICE protocol and pain medicine indicated and discussed with patient. I advised the patient to follow-up with their primary care provider this week. I advised the patient to return to the emergency department with new or worsening symptoms or new concerns. The patient verbalized understanding and agreement with plan.      Final Clinical Impressions(s) / ED Diagnoses   Final diagnoses:  Acute right-sided low back pain without sciatica    New Prescriptions New Prescriptions   METHOCARBAMOL (ROBAXIN) 500 MG TABLET    Take 1 tablet (500 mg total) by mouth 2 (two) times daily as needed for muscle spasms.   NAPROXEN (NAPROSYN) 500 MG TABLET    Take 1 tablet (500 mg total) by mouth 2 (two) times daily with a meal.   I personally performed the services described in this documentation, which was scribed in my presence. The recorded information has  been reviewed and is accurate.       Everlene Farrier, PA-C 12/05/16 2016    Alvira Monday, MD 12/06/16 1407

## 2016-12-05 NOTE — ED Notes (Signed)
Called pt in lobby no answer

## 2016-12-08 ENCOUNTER — Encounter (HOSPITAL_COMMUNITY): Payer: Self-pay | Admitting: Emergency Medicine

## 2016-12-08 ENCOUNTER — Emergency Department (HOSPITAL_COMMUNITY): Payer: Self-pay

## 2016-12-08 ENCOUNTER — Emergency Department (HOSPITAL_COMMUNITY)
Admission: EM | Admit: 2016-12-08 | Discharge: 2016-12-08 | Disposition: A | Discharge status: Home / Self Care | Payer: Self-pay | Attending: Emergency Medicine | Admitting: Emergency Medicine

## 2016-12-08 DIAGNOSIS — S39012D Strain of muscle, fascia and tendon of lower back, subsequent encounter: Secondary | ICD-10-CM

## 2016-12-08 DIAGNOSIS — W228XXA Striking against or struck by other objects, initial encounter: Secondary | ICD-10-CM | POA: Insufficient documentation

## 2016-12-08 DIAGNOSIS — S39012A Strain of muscle, fascia and tendon of lower back, initial encounter: Secondary | ICD-10-CM | POA: Insufficient documentation

## 2016-12-08 DIAGNOSIS — Y9289 Other specified places as the place of occurrence of the external cause: Secondary | ICD-10-CM | POA: Insufficient documentation

## 2016-12-08 DIAGNOSIS — Y990 Civilian activity done for income or pay: Secondary | ICD-10-CM | POA: Insufficient documentation

## 2016-12-08 DIAGNOSIS — Y939 Activity, unspecified: Secondary | ICD-10-CM | POA: Insufficient documentation

## 2016-12-08 MED ORDER — PREDNISONE 50 MG PO TABS: 50.0000 mg | ORAL_TABLET | Freq: Every day | ORAL | 0 refills | Status: DC

## 2016-12-08 MED ORDER — HYDROCODONE-ACETAMINOPHEN 5-325 MG PO TABS: 1.0000 | ORAL_TABLET | Freq: Four times a day (QID) | ORAL | 0 refills | Status: DC | PRN

## 2016-12-08 MED ORDER — CYCLOBENZAPRINE HCL 10 MG PO TABS: 10.0000 mg | ORAL_TABLET | Freq: Every day | ORAL | 0 refills | Status: DC

## 2016-12-08 NOTE — ED Notes (Signed)
Patient is A&Ox4 at this time.  Patient in no signs of distress.  Please see providers note for complete history and physical exam.  
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
## 2016-12-08 NOTE — Discharge Instructions (Signed)
Your x-rays were normal. Return here as needed.

## 2016-12-08 NOTE — ED Triage Notes (Signed)
Pt presents with severe lower back pain since 3/5 where he injured himself at work involving a lift; pt denies numbness, tingling, incontinence; pt reports pain has got unbearable and worsens with bending and sitting

## 2016-12-08 NOTE — ED Notes (Signed)
Patient Alert and oriented X4. Stable and ambulatory. Patient verbalized understanding of the discharge instructions.  Patient belongings were taken by the patient.  

## 2018-05-09 ENCOUNTER — Ambulatory Visit (HOSPITAL_COMMUNITY)
Admission: EM | Admit: 2018-05-09 | Discharge: 2018-05-09 | Disposition: A | Discharge status: Home / Self Care | Payer: Self-pay | Attending: Family Medicine | Admitting: Family Medicine

## 2018-05-09 ENCOUNTER — Ambulatory Visit (HOSPITAL_COMMUNITY): Admission: EM | Admit: 2018-05-09 | Discharge: 2018-05-09 | Discharge status: Absent without leave | Payer: Self-pay

## 2018-05-09 ENCOUNTER — Encounter (HOSPITAL_COMMUNITY): Payer: Self-pay

## 2018-05-09 DIAGNOSIS — R030 Elevated blood-pressure reading, without diagnosis of hypertension: Secondary | ICD-10-CM

## 2018-05-09 DIAGNOSIS — M778 Other enthesopathies, not elsewhere classified: Secondary | ICD-10-CM

## 2018-05-09 MED ORDER — NAPROXEN 375 MG PO TABS: 375.0000 mg | ORAL_TABLET | Freq: Two times a day (BID) | ORAL | 0 refills | Status: DC

## 2018-05-09 NOTE — ED Triage Notes (Signed)
Pt presents with numbness in left hands

## 2018-05-09 NOTE — ED Provider Notes (Signed)
[facility]   [id number] 05/09/18 Arrival Time: 1349  CC: left hand numbness  SUBJECTIVE: History from: patient. Brian Kim is a 37 y.o. male complains of left hand numbness that began 48 hours ago.  Denies a precipitating event or specific injury, but reports doing a lot of repetitive hand activities for his job in [employer].  Localizes the symptoms to the left hand.  Describes the symptoms as constant  Denies pain.  Has NOT tried OTC medications without relief.  Symptoms are made worse with full extension of hand.  Denies similar symptoms in the past.  Complains of numbness and weakness.  Denies fever, chills, chest pain, SOB, erythema, ecchymosis, effusion, and tingling.      ROS: As per HPI.  History reviewed. No pertinent past medical history. Past Surgical History:  Procedure Laterality Date  . WISDOM TOOTH EXTRACTION     Allergies  Allergen Reactions  . Anesthetics, Amide Other (See Comments)    Patient passed out   No current facility-administered medications on file prior to encounter.    Current Outpatient Medications on File Prior to Encounter  Medication Sig Dispense Refill  . acetaminophen (TYLENOL) 325 MG tablet Take 650-975 mg by mouth every 6 (six) hours as needed for moderate pain.    . cyclobenzaprine (FLEXERIL) 10 MG tablet Take 1 tablet (10 mg total) by mouth at bedtime. 10 tablet 0  . HYDROcodone-acetaminophen (NORCO/VICODIN) 5-325 MG tablet Take 1 tablet by mouth every 6 (six) hours as needed for moderate pain. 15 tablet 0  . methocarbamol (ROBAXIN) 500 MG tablet Take 1 tablet (500 mg total) by mouth 2 (two) times daily as needed for muscle spasms. 20 tablet 0  . predniSONE (DELTASONE) 50 MG tablet Take 1 tablet (50 mg total) by mouth daily. 5 tablet 0   Social History   Socioeconomic History  . Marital status: Single    Spouse name: Not on file  . Number of children: Not on file  . Years of education: Not on file  . Highest education  level: Not on file  Occupational History  . Not on file  Social Needs  . Financial resource strain: Not on file  . Food insecurity:    Worry: Not on file    Inability: Not on file  . Transportation needs:    Medical: Not on file    Non-medical: Not on file  Tobacco Use  . Smoking status: Never Smoker  . Smokeless tobacco: Never Used  Substance and Sexual Activity  . Alcohol use: Yes    Comment: approx 100oz per day of beer.  . Drug use: No  . Sexual activity: Not on file  Lifestyle  . Physical activity:    Days per week: Not on file    Minutes per session: Not on file  . Stress: Not on file  Relationships  . Social connections:    Talks on phone: Not on file    Gets together: Not on file    Attends religious service: Not on file    Active member of club or organization: Not on file    Attends meetings of clubs or organizations: Not on file    Relationship status: Not on file  . Intimate partner violence:    Fear of current or ex partner: Not on file    Emotionally abused: Not on file    Physically abused: Not on file    Forced sexual activity: Not on file  Other Topics Concern  .  Not on file  Social History Narrative  . Not on file   History reviewed. No pertinent family history.  OBJECTIVE:  Vitals:   05/09/18 1424 05/09/18 1456  BP: (!) 146/101 (!) 136/97  Pulse: 83 77  Resp: 20 16  Temp: 98.5 F (36.9 C)   TempSrc: Temporal   SpO2: 100% 99%    General appearance: AOx3; in no acute distress.  Head: NCAT Lungs: CTA bilaterally Heart: RRR.  Clear S1 and S2 without murmur, gallops, or rubs.  Radial pulses 2+ bilaterally. Musculoskeletal: Left hand Inspection: Skin warm, dry, clear and intact without obvious erythema, effusion, or ecchymosis.  Palpation: Nontender to palpation ROM: FROM active and passive Strength: 5/5 shld abduction, 5/5 shld adduction, 5/5 elbow flexion, 5/5 elbow extension, 5/5 grip strength, 4/5 finger abduction/ adduction Negative  Phalen's and Tinel's sign Skin: warm and dry Neurologic: Ambulates without difficulty; Sensation intact about the upper/ lower extremities Psychological: alert and cooperative; anxious mood and affect  MDM:   Discussed patient case with Dr. Tracie HarrierHagler.  Healthy 37 year old male with left hand numbness and weakness.  Denies specific injury, but admits to repetitive activities with hand due to working in [employer].  Symptoms localized to left hand.  VS stable, exam unremarkable except for slight decreased strength with finger adduction/abduction.  Offered further management and evaluation in the ED.  Patient would like to try conservative treatment for wrist tendinitis for now with naproxen and wrist splint.  Will follow up with PCP if symptoms persists.  Given strict ED precautions if symptoms progress, worsen, or if he experiences any new symptoms like whole limb weakness or numbness, facial droop, slurred speech, difficulty walking,etc... Patient aware and in agreement with this plan.    ASSESSMENT & PLAN:  1. Left wrist tendinitis   2. Elevated blood pressure reading      Meds ordered this encounter  Medications  . naproxen (NAPROSYN) 375 MG tablet    Sig: Take 1 tablet (375 mg total) by mouth 2 (two) times daily.    Dispense:  20 tablet    Refill:  0    Order Specific Question:   Supervising Provider    Answer:   Isa RankinMURRAY, LAURA WILSON [[id number]]   Wrist braced placed Continue conservative management of rest, ice, compression, and elevation Take naproxen as needed for pain relief (may cause abdominal discomfort, ulcers, and GI bleeds avoid taking with other NSAIDs) Follow up with PCP if symptoms persist Return or go to the ER if you have any new or worsening symptoms (fever, chills, chest pain, abdominal pain, changes in bowel or bladder habits, pain radiating into lower legs, etc...)   Blood pressure elevated in office.  Please recheck in 24 hours.  If it continues to be greater than  140/90 please follow up with PCP for further evaluation and management.   Reviewed expectations re: course of current medical issues. Questions answered. Outlined signs and symptoms indicating need for more acute intervention. Patient verbalized understanding. After Visit Summary given.    Rennis HardingWurst, Bleu Moisan, PA-C 05/09/18 1511

## 2018-05-09 NOTE — Discharge Instructions (Addendum)
Wrist braced placed Continue conservative management of rest, ice, compression, and elevation Take naproxen as needed for pain relief (may cause abdominal discomfort, ulcers, and GI bleeds avoid taking with other NSAIDs) Follow up with PCP if symptoms persist Return or go to the ER if you have any new or worsening symptoms (fever, chills, chest pain, abdominal pain, changes in bowel or bladder habits, pain radiating into lower legs, etc...)   Blood pressure elevated in office.  Please recheck in 24 hours.  If it continues to be greater than 140/90 please follow up with PCP for further evaluation and management.

## 2018-11-21 ENCOUNTER — Encounter: Payer: Self-pay | Admitting: Pediatric Intensive Care

## 2018-12-03 NOTE — Congregational Nurse Program (Signed)
  Dept: [phone]   Congregational Nurse Program Note  Date of Encounter: 11/21/2018  Past Medical History: No past medical history on file.  Encounter Details: new client encounter. Client requests assistance with counseling for alcohol use. Client states recent incarceration for DWI. Client states 8-12 beers/day x 5 years use. Client states nausea, vomiting, tremors and diaphoresis when he stops drinking. He drank beer earlier today. Client is currently living with friends but has no permanent housing. He wants to stop drinking because he "wants to be a good father". Client referred to [facility] clinic due to increased BP. Given information on referral to [facility] for counseling. Encouraged to return to [facility] clinic to discuss feelings regarding need to drink. CN also referred client to [facility] for inofrmation regarding housing. Shann Medal RN BSN CNP [phone]

## 2022-09-23 ENCOUNTER — Other Ambulatory Visit: Payer: Self-pay

## 2022-09-23 ENCOUNTER — Emergency Department (HOSPITAL_BASED_OUTPATIENT_CLINIC_OR_DEPARTMENT_OTHER)
Admission: EM | Admit: 2022-09-23 | Discharge: 2022-09-23 | Disposition: A | Discharge status: Home / Self Care | Payer: Self-pay | Attending: Emergency Medicine | Admitting: Emergency Medicine

## 2022-09-23 DIAGNOSIS — F1010 Alcohol abuse, uncomplicated: Secondary | ICD-10-CM | POA: Insufficient documentation

## 2022-09-23 DIAGNOSIS — R748 Abnormal levels of other serum enzymes: Secondary | ICD-10-CM | POA: Insufficient documentation

## 2022-09-23 LAB — CBC WITH DIFFERENTIAL/PLATELET
Abs Immature Granulocytes: 0.01 10*3/uL (ref 0.00–0.07)
Basophils Absolute: 0 10*3/uL (ref 0.0–0.1)
Basophils Relative: 1 %
Eosinophils Absolute: 0.4 10*3/uL (ref 0.0–0.5)
Eosinophils Relative: 5 %
HCT: 47.5 % (ref 39.0–52.0)
Hemoglobin: 15.9 g/dL (ref 13.0–17.0)
Immature Granulocytes: 0 %
Lymphocytes Relative: 26 %
Lymphs Abs: 2.1 10*3/uL (ref 0.7–4.0)
MCH: 29.7 pg (ref 26.0–34.0)
MCHC: 33.5 g/dL (ref 30.0–36.0)
MCV: 88.8 fL (ref 80.0–100.0)
Monocytes Absolute: 0.7 10*3/uL (ref 0.1–1.0)
Monocytes Relative: 9 %
Neutro Abs: 4.8 10*3/uL (ref 1.7–7.7)
Neutrophils Relative %: 59 %
Platelets: 222 10*3/uL (ref 150–400)
RBC: 5.35 MIL/uL (ref 4.22–5.81)
RDW: 14.5 % (ref 11.5–15.5)
WBC: 8.1 10*3/uL (ref 4.0–10.5)
nRBC: 0 % (ref 0.0–0.2)

## 2022-09-23 LAB — COMPREHENSIVE METABOLIC PANEL
ALT: 146 U/L — ABNORMAL HIGH (ref 0–44)
AST: 183 U/L — ABNORMAL HIGH (ref 15–41)
Albumin: 3.9 g/dL (ref 3.5–5.0)
Alkaline Phosphatase: 126 U/L (ref 38–126)
Anion gap: 11 (ref 5–15)
BUN: <5 mg/dL — ABNORMAL LOW (ref 6–20)
CO2: 26 mmol/L (ref 22–32)
Calcium: 9 mg/dL (ref 8.9–10.3)
Chloride: 101 mmol/L (ref 98–111)
Creatinine, Ser: 0.69 mg/dL (ref 0.61–1.24)
GFR, Estimated: >60 mL/min (ref >60)
Glucose, Bld: 159 mg/dL — ABNORMAL HIGH (ref 70–99)
Potassium: 3.2 mmol/L — ABNORMAL LOW (ref 3.5–5.1)
Sodium: 138 mmol/L (ref 135–145)
Total Bilirubin: 1 mg/dL (ref 0.3–1.2)
Total Protein: 8.2 g/dL — ABNORMAL HIGH (ref 6.5–8.1)

## 2022-09-23 LAB — SALICYLATE LEVEL: Salicylate Lvl: <7 mg/dL — ABNORMAL LOW (ref 7.0–30.0)

## 2022-09-23 LAB — ETHANOL: Alcohol, Ethyl (B): 289 mg/dL — ABNORMAL HIGH (ref <10)

## 2022-09-23 LAB — ACETAMINOPHEN LEVEL: Acetaminophen (Tylenol), Serum: <10 ug/mL — ABNORMAL LOW (ref 10–30)

## 2022-09-23 MED ORDER — SODIUM CHLORIDE 0.9 % IV BOLUS
1000.0000 mL | Freq: Once | INTRAVENOUS | Status: AC
  Administered 2022-09-23: 1000 mL via INTRAVENOUS

## 2022-09-23 MED ORDER — ONDANSETRON HCL 4 MG/2ML IJ SOLN
4.0000 mg | Freq: Once | INTRAMUSCULAR | Status: AC
  Administered 2022-09-23: 4 mg via INTRAVENOUS
  Filled 2022-09-23: qty 2

## 2022-09-23 MED ORDER — CHLORDIAZEPOXIDE HCL 25 MG PO CAPS: ORAL_CAPSULE | ORAL | 0 refills | Status: DC

## 2022-09-23 MED ORDER — ONDANSETRON HCL 4 MG PO TABS: 4.0000 mg | ORAL_TABLET | Freq: Three times a day (TID) | ORAL | 0 refills | Status: DC | PRN

## 2022-09-23 NOTE — Discharge Instructions (Signed)
You were evaluated today for alcohol abuse disorder with mild withdrawal symptoms.  I have prescribed Librium which should help treat your withdrawal symptoms and also Zofran which will help with nausea and vomiting.  Your liver enzymes were elevated today likely due to alcohol abuse over time.  Please follow-up with primary care for further evaluation of your liver.  If you develop any significant withdrawal symptoms such as seizure-like activity or difficulty breathing please return to the emergency department.

## 2022-09-23 NOTE — ED Triage Notes (Signed)
Patient presents to ED via POV from home. Here requesting help with alcohol withdrawal. Reports last drink was 0300 this morning. Reports history of going through withdrawal. Denies seizures from same or ever requiring hospitalization for same. Denies SI/HI.

## 2022-09-23 NOTE — ED Provider Notes (Signed)
[facility] EMERGENCY DEPARTMENT Provider Note   CSN: [id number] Arrival date & time: 09/23/22  1036     History  Chief Complaint  Patient presents with   Alcohol Problem    Brian Kim is a 41 y.o. male.  Patient presents the emergency department with concerns about needing treatment to assist with alcohol withdrawal symptoms.  Patient states that he normally drinks approximately 18 or more beers daily.  His last drink was at 3 AM this morning.  He states he has gone through withdrawals before but has never had to be hospitalized in the past.  The patient states that approximate 2 years ago he had the same situation and was given some sort of medication to help with withdrawal as an outpatient.  Patient currently endorses nausea with 1-2 episodes of vomiting.  Denies abdominal pain, chest pain, shortness of breath, seizure-like activity, history of seizure-like activity.  Patient denies any SI or HI at this time.  No relevant past medical history on file for the patient  HPI     Home Medications Prior to Admission medications   Medication Sig Start Date End Date Taking? Authorizing Provider  chlordiazePOXIDE (LIBRIUM) 25 MG capsule 50mg  PO TID x 1D, then 25-50mg  PO BID X 1D, then 25-50mg  PO QD X 1D 09/23/22  Yes Tyeasha Ebbs B, PA-C  ondansetron (ZOFRAN) 4 MG tablet Take 1 tablet (4 mg total) by mouth every 8 (eight) hours as needed for nausea or vomiting. 09/23/22  Yes 09/25/22, PA-C  acetaminophen (TYLENOL) 325 MG tablet Take 650-975 mg by mouth every 6 (six) hours as needed for moderate pain.    [provider]  cyclobenzaprine (FLEXERIL) 10 MG tablet Take 1 tablet (10 mg total) by mouth at bedtime. 12/08/16   Lawyer, 02/07/17, PA-C  HYDROcodone-acetaminophen (NORCO/VICODIN) 5-325 MG tablet Take 1 tablet by mouth every 6 (six) hours as needed for moderate pain. 12/08/16   Lawyer, 02/07/17, PA-C  methocarbamol (ROBAXIN) 500 MG tablet Take 1  tablet (500 mg total) by mouth 2 (two) times daily as needed for muscle spasms. 12/05/16   02/04/17, PA-C  naproxen (NAPROSYN) 375 MG tablet Take 1 tablet (375 mg total) by mouth 2 (two) times daily. 05/09/18   Wurst, 07/09/18, PA-C  predniSONE (DELTASONE) 50 MG tablet Take 1 tablet (50 mg total) by mouth daily. 12/08/16   Lawyer, 02/07/17, PA-C      Allergies    Anesthetics, amide    Review of Systems   Review of Systems  Gastrointestinal:  Positive for nausea and vomiting.    Physical Exam Updated Vital Signs BP (!) 143/79   Pulse 89   Temp 98 F (36.7 C) (Oral)   Resp 18   SpO2 95%  Physical Exam Vitals and nursing note reviewed.  Constitutional:      General: He is not in acute distress.    Appearance: He is well-developed.  HENT:     Head: Normocephalic and atraumatic.  Eyes:     Conjunctiva/sclera: Conjunctivae normal.  Cardiovascular:     Rate and Rhythm: Normal rate and regular rhythm.     Heart sounds: No murmur heard. Pulmonary:     Effort: Pulmonary effort is normal. No respiratory distress.     Breath sounds: Normal breath sounds.  Abdominal:     Palpations: Abdomen is soft.     Tenderness: There is no abdominal tenderness.  Musculoskeletal:        General: No swelling.  Cervical back: Neck supple.  Skin:    General: Skin is warm and dry.     Capillary Refill: Capillary refill takes less than 2 seconds.  Neurological:     Mental Status: He is alert.  Psychiatric:        Mood and Affect: Mood normal.     ED Results / Procedures / Treatments   Labs (all labs ordered are listed, but only abnormal results are displayed) Labs Reviewed  COMPREHENSIVE METABOLIC PANEL - Abnormal; Notable for the following components:      Result Value   Potassium 3.2 (*)    Glucose, Bld 159 (*)    BUN <5 (*)    Total Protein 8.2 (*)    AST 183 (*)    ALT 146 (*)    All other components within normal limits  ETHANOL - Abnormal; Notable for the following  components:   Alcohol, Ethyl (B) 289 (*)    All other components within normal limits  SALICYLATE LEVEL - Abnormal; Notable for the following components:   Salicylate Lvl <7.0 (*)    All other components within normal limits  ACETAMINOPHEN LEVEL - Abnormal; Notable for the following components:   Acetaminophen (Tylenol), Serum <10 (*)    All other components within normal limits  CBC WITH DIFFERENTIAL/PLATELET    EKG EKG Interpretation  Date/Time:  Saturday September 23 2022 11:24:50 EST Ventricular Rate:  90 PR Interval:  148 QRS Duration: 91 QT Interval:  375 QTC Calculation: 459 R Axis:   84 Text Interpretation: Sinus rhythm ST elev, probable normal early repol pattern Confirmed by Vanetta Mulders [phone]) on 09/23/2022 11:32:19 AM  Radiology No results found.  Procedures Procedures    Medications Ordered in ED Medications  sodium chloride 0.9 % bolus 1,000 mL (0 mLs Intravenous Stopped 09/23/22 1240)  ondansetron (ZOFRAN) injection 4 mg (4 mg Intravenous Given 09/23/22 1133)    ED Course/ Medical Decision Making/ A&P                           Medical Decision Making Amount and/or Complexity of Data Reviewed Labs: ordered.  Risk Prescription drug management.   Patient presents with chief concern of requesting assistance with alcohol withdrawal symptoms.  I reviewed the patient's past medical history and found multiple emergency department visits 2018 but no visits over the past few years related to alcohol withdrawal  I ordered and reviewed labs.  Alcohol level 289, negative salicylate, negative acetaminophen, potassium 3.2, AST and ALT elevated at 183 and 146 respectively, unremarkable CBC  There is no indication at this time for imaging  I ordered the patient Zofran for nausea and a saline bolus for fluid resuscitation.  Upon reassessment the patient's nausea had subsided.   The patient has a CIWA score of 3.  He does have elevated liver enzymes but is  not interested in admission at this time.  This is likely due to the chronic alcohol abuse.  Plan to discharge patient home with prescription for Librium and Zofran.  Strongly recommend patient follow-up with primary care provider soon as possible for further evaluation of elevated liver enzymes and to see if they resolved with cessation of alcohol abuse.  Return precautions provided including any seizure-like activity, difficulty breathing, significant withdrawal symptoms       Final Clinical Impression(s) / ED Diagnoses Final diagnoses:  Alcohol abuse  Elevated liver enzymes    Rx / DC Orders ED Discharge Orders  Ordered    chlordiazePOXIDE (LIBRIUM) 25 MG capsule        09/23/22 1233    ondansetron (ZOFRAN) 4 MG tablet  Every 8 hours PRN        09/23/22 1233              Pamala Duffel 09/23/22 1258    Vanetta Mulders, MD 09/25/22 1712

## 2023-03-18 ENCOUNTER — Encounter (HOSPITAL_COMMUNITY): Payer: Self-pay | Admitting: Emergency Medicine

## 2023-03-18 ENCOUNTER — Ambulatory Visit (HOSPITAL_COMMUNITY)
Admission: EM | Admit: 2023-03-18 | Discharge: 2023-03-18 | Disposition: A | Discharge status: Home / Self Care | Payer: 59 | Attending: Emergency Medicine | Admitting: Emergency Medicine

## 2023-03-18 DIAGNOSIS — L259 Unspecified contact dermatitis, unspecified cause: Secondary | ICD-10-CM

## 2023-03-18 DIAGNOSIS — H5789 Other specified disorders of eye and adnexa: Secondary | ICD-10-CM | POA: Diagnosis not present

## 2023-03-18 MED ORDER — CETIRIZINE HCL 10 MG PO TABS: 10.0000 mg | ORAL_TABLET | Freq: Every day | ORAL | 2 refills | Status: AC

## 2023-03-18 MED ORDER — HYDROCORTISONE 2.5 % EX LOTN: TOPICAL_LOTION | Freq: Two times a day (BID) | CUTANEOUS | 0 refills | Status: AC

## 2023-03-18 MED ORDER — OLOPATADINE HCL 0.1 % OP SOLN: 1.0000 [drp] | Freq: Two times a day (BID) | OPHTHALMIC | 0 refills | Status: AC

## 2023-03-18 NOTE — ED Provider Notes (Signed)
[facility]    CSN: [id number] Arrival date & time: 03/18/23  1617     History   Chief Complaint Chief Complaint  Patient presents with   Rash   Eye Drainage   Finger Injury    HPI Brian Kim is a 42 y.o. male.  Here with 3 history of rash on the right side of his face and neck. Itching, not painful. He may have been exposed to poison ivy working outside. No medications or interventions attempted.  Additionally reports his eyes have been red for 3 weeks. They are very crusty in the mornings. No discharge or drainage. No eye pain or vision loss.  He drinks a lot of alcohol and wonders if this is related.   No fever. Denies shortness of breath or wheezing. No swelling  History reviewed. No pertinent past medical history.  There are no problems to display for this patient.   Past Surgical History:  Procedure Laterality Date   WISDOM TOOTH EXTRACTION       Home Medications    Prior to Admission medications   Medication Sig Start Date End Date Taking? Authorizing Provider  cetirizine (ZYRTEC ALLERGY) 10 MG tablet Take 1 tablet (10 mg total) by mouth daily. 03/18/23  Yes Johnnie Moten, Lurena Joiner, PA-C  hydrocortisone 2.5 % lotion Apply topically 2 (two) times daily for 7 days. 03/18/23 03/25/23 Yes Shilpa Bushee, Lurena Joiner, PA-C  olopatadine (PATANOL) 0.1 % ophthalmic solution Place 1 drop into both eyes 2 (two) times daily. 03/18/23  Yes Domenique Quest, Lurena Joiner, PA-C  acetaminophen (TYLENOL) 325 MG tablet Take 650-975 mg by mouth every 6 (six) hours as needed for moderate pain.    [provider]    Family History No family history on file.  Social History Social History   Tobacco Use   Smoking status: Never   Smokeless tobacco: Never  Substance Use Topics   Alcohol use: Yes    Comment: approx 100oz per day of beer.   Drug use: No     Allergies   Anesthetics, amide   Review of Systems Review of Systems As per HPI  Physical Exam Triage Vital Signs ED  Triage Vitals  Enc Vitals Group     BP 03/18/23 1643 126/88     Pulse Rate 03/18/23 1643 92     Resp 03/18/23 1643 17     Temp 03/18/23 1643 98.4 F (36.9 C)     Temp Source 03/18/23 1643 Oral     SpO2 03/18/23 1643 96 %     Weight --      Height --      Head Circumference --      Peak Flow --      Pain Score 03/18/23 1644 0     Pain Loc --      Pain Edu? --      Excl. in GC? --    No data found.  Updated Vital Signs BP 126/88 (BP Location: Right Arm)   Pulse 92   Temp 98.4 F (36.9 C) (Oral)   Resp 17   SpO2 96%    Physical Exam Vitals and nursing note reviewed.  Constitutional:      General: He is not in acute distress.    Appearance: Normal appearance.  HENT:     Head:      Comments: Very minimal rash. There is one erythematous streak on the right cheek about 1 inch long, and one patch on the right neck. There is no rash  or itching elsewhere on the body    Mouth/Throat:     Mouth: Mucous membranes are moist.     Pharynx: Oropharynx is clear. No posterior oropharyngeal erythema.  Eyes:     General: Lids are normal. Vision grossly intact. Gaze aligned appropriately.        Right eye: No foreign body or discharge.        Left eye: No foreign body or discharge.     Extraocular Movements: Extraocular movements intact.     Conjunctiva/sclera:     Right eye: Right conjunctiva is injected.     Left eye: Left conjunctiva is injected.     Pupils: Pupils are equal, round, and reactive to light.     Comments: Bilat conjunctiva minimally injected. No crusting or drainage. EOM intact  Cardiovascular:     Rate and Rhythm: Normal rate and regular rhythm.     Pulses: Normal pulses.     Heart sounds: Normal heart sounds.  Pulmonary:     Effort: Pulmonary effort is normal. No respiratory distress.     Breath sounds: Normal breath sounds. No wheezing.  Abdominal:     Palpations: Abdomen is soft.  Musculoskeletal:        General: Normal range of motion.     Cervical back:  Normal range of motion.  Neurological:     Mental Status: He is alert and oriented to person, place, and time.     UC Treatments / Results  Labs (all labs ordered are listed, but only abnormal results are displayed) Labs Reviewed - No data to display  EKG  Radiology No results found.  Procedures Procedures  Medications Ordered in UC Medications - No data to display  Initial Impression / Assessment and Plan / UC Course  I have reviewed the triage vital signs and the nursing notes.  Pertinent labs & imaging results that were available during my care of the patient were reviewed by me and considered in my medical decision making (see chart for details).  Mild, minimal rash. See physical exam. The patient had requested a steroid shot for his rash. Discussion about how this would not be warranted. He will benefit from hydrocortisone cream BID. Also recommend to start once daily zyrtec. For eye symptoms, olopatadine drops BID for the next week. No sign of infection, abx drops not warranted at this time. All questions answered.  Final Clinical Impressions(s) / UC Diagnoses   Final diagnoses:  Contact dermatitis, unspecified contact dermatitis type, unspecified trigger  Eye redness     Discharge Instructions      Eye drops twice daily for the next week  Take once daily zyrtec to help reduce itching. This can help with eye itching too.  Apply hydrocortisone cream twice daily to the areas of itch. Use for the next week     ED Prescriptions     Medication Sig Dispense Auth. Provider   olopatadine (PATANOL) 0.1 % ophthalmic solution Place 1 drop into both eyes 2 (two) times daily. 5 mL Beyonce Sawatzky, PA-C   hydrocortisone 2.5 % lotion Apply topically 2 (two) times daily for 7 days. 59 mL Freya Zobrist, PA-C   cetirizine (ZYRTEC ALLERGY) 10 MG tablet Take 1 tablet (10 mg total) by mouth daily. 30 tablet Sorah Falkenstein, Lurena Joiner, PA-C      PDMP not reviewed this encounter.    Arwyn Besaw, Ray Church 03/18/23 1722

## 2023-03-18 NOTE — Discharge Instructions (Signed)
Eye drops twice daily for the next week  Take once daily zyrtec to help reduce itching. This can help with eye itching too.  Apply hydrocortisone cream twice daily to the areas of itch. Use for the next week

## 2023-03-18 NOTE — ED Triage Notes (Signed)
Pt reports for rash on right side of face, neck and hands for a few days. Believes poison ivy. Hasn't tried creams or medications for rash and itching.   Pt reports that his eyes have been red and crusted shut in mornings for couple weeks.  Pt adds that he stuck himself with a knife in left thumb several weeks ago when trying to open oysters. Reports that healed back good but thumb is numb.  Last tetanus shot was 3 years ago.

## 2024-01-08 ENCOUNTER — Inpatient Hospital Stay (HOSPITAL_COMMUNITY): Payer: MEDICAID

## 2024-01-08 ENCOUNTER — Emergency Department (HOSPITAL_COMMUNITY): Payer: MEDICAID

## 2024-01-08 ENCOUNTER — Inpatient Hospital Stay (HOSPITAL_COMMUNITY)
Admission: EM | Admit: 2024-01-08 | Discharge: 2024-01-09 | DRG: 894 | Discharge status: Against Medical Advice | Payer: MEDICAID | Attending: Internal Medicine | Admitting: Internal Medicine

## 2024-01-08 ENCOUNTER — Other Ambulatory Visit: Payer: Self-pay

## 2024-01-08 DIAGNOSIS — F10229 Alcohol dependence with intoxication, unspecified: Secondary | ICD-10-CM | POA: Diagnosis present

## 2024-01-08 DIAGNOSIS — K7010 Alcoholic hepatitis without ascites: Secondary | ICD-10-CM | POA: Diagnosis present

## 2024-01-08 DIAGNOSIS — R7401 Elevation of levels of liver transaminase levels: Secondary | ICD-10-CM

## 2024-01-08 DIAGNOSIS — Y908 Blood alcohol level of 240 mg/100 ml or more: Secondary | ICD-10-CM | POA: Diagnosis present

## 2024-01-08 DIAGNOSIS — F10929 Alcohol use, unspecified with intoxication, unspecified: Principal | ICD-10-CM | POA: Diagnosis present

## 2024-01-08 DIAGNOSIS — F10920 Alcohol use, unspecified with intoxication, uncomplicated: Secondary | ICD-10-CM

## 2024-01-08 DIAGNOSIS — G928 Other toxic encephalopathy: Secondary | ICD-10-CM | POA: Diagnosis present

## 2024-01-08 DIAGNOSIS — G9341 Metabolic encephalopathy: Secondary | ICD-10-CM

## 2024-01-08 DIAGNOSIS — Z781 Physical restraint status: Secondary | ICD-10-CM

## 2024-01-08 DIAGNOSIS — J9601 Acute respiratory failure with hypoxia: Secondary | ICD-10-CM

## 2024-01-08 MED ORDER — PANTOPRAZOLE SODIUM 40 MG IV SOLR
40.0000 mg | Freq: Every day | INTRAVENOUS | Status: DC
  Administered 2024-01-09: 40 mg via INTRAVENOUS
  Filled 2024-01-08: qty 10

## 2024-01-08 MED ORDER — FENTANYL BOLUS VIA INFUSION: 50.0000 ug | INTRAVENOUS | Status: DC | PRN

## 2024-01-08 MED ORDER — FENTANYL CITRATE PF 50 MCG/ML IJ SOSY: 50.0000 ug | PREFILLED_SYRINGE | INTRAMUSCULAR | Status: DC | PRN

## 2024-01-08 MED ORDER — MIDAZOLAM HCL 2 MG/2ML IJ SOLN: 1.0000 mg | INTRAMUSCULAR | Status: DC | PRN

## 2024-01-08 MED ORDER — FENTANYL 2500MCG IN NS 250ML (10MCG/ML) PREMIX INFUSION
50.0000 ug/h | INTRAVENOUS | Status: DC
  Administered 2024-01-08: 50 ug/h via INTRAVENOUS
  Filled 2024-01-08: qty 250

## 2024-01-08 MED ORDER — FENTANYL CITRATE PF 50 MCG/ML IJ SOSY
50.0000 ug | PREFILLED_SYRINGE | INTRAMUSCULAR | Status: DC | PRN
  Administered 2024-01-08: 50 ug via INTRAVENOUS
  Filled 2024-01-08: qty 1

## 2024-01-08 MED ORDER — PROPOFOL 1000 MG/100ML IV EMUL
5.0000 ug/kg/min | INTRAVENOUS | Status: DC
  Administered 2024-01-08: 30 ug/kg/min via INTRAVENOUS
  Administered 2024-01-09 (×2): 40 ug/kg/min via INTRAVENOUS
  Filled 2024-01-08 (×2): qty 100

## 2024-01-08 MED ORDER — HEPARIN SODIUM (PORCINE) 5000 UNIT/ML IJ SOLN: 5000.0000 [IU] | Freq: Three times a day (TID) | INTRAMUSCULAR | Status: DC

## 2024-01-08 MED ORDER — THIAMINE HCL 100 MG/ML IJ SOLN
100.0000 mg | Freq: Every day | INTRAMUSCULAR | Status: DC
  Administered 2024-01-09: 100 mg via INTRAVENOUS
  Filled 2024-01-08: qty 2

## 2024-01-08 MED ORDER — DOCUSATE SODIUM 50 MG/5ML PO LIQD: 100.0000 mg | Freq: Two times a day (BID) | ORAL | Status: DC | PRN

## 2024-01-08 MED ORDER — DOCUSATE SODIUM 50 MG/5ML PO LIQD: 100.0000 mg | Freq: Two times a day (BID) | ORAL | Status: DC

## 2024-01-08 MED ORDER — FOLIC ACID 5 MG/ML IJ SOLN
1.0000 mg | Freq: Every day | INTRAMUSCULAR | Status: DC
  Filled 2024-01-08 (×3): qty 0.2

## 2024-01-08 MED ORDER — POLYETHYLENE GLYCOL 3350 17 G PO PACK: 17.0000 g | PACK | Freq: Every day | ORAL | Status: DC | PRN

## 2024-01-08 MED ORDER — POLYETHYLENE GLYCOL 3350 17 G PO PACK: 17.0000 g | PACK | Freq: Every day | ORAL | Status: DC

## 2024-01-08 MED ORDER — LACTATED RINGERS IV SOLN: INTRAVENOUS | Status: DC

## 2024-01-08 MED ORDER — CALCIUM GLUCONATE-NACL 1-0.675 GM/50ML-% IV SOLN
1.0000 g | Freq: Once | INTRAVENOUS | Status: AC
  Administered 2024-01-08: 1000 mg via INTRAVENOUS
  Filled 2024-01-08: qty 50

## 2024-01-09 ENCOUNTER — Inpatient Hospital Stay (HOSPITAL_COMMUNITY): Payer: MEDICAID

## 2024-01-09 ENCOUNTER — Encounter (HOSPITAL_COMMUNITY): Payer: Self-pay | Admitting: Emergency Medicine

## 2024-01-09 DIAGNOSIS — F10920 Alcohol use, unspecified with intoxication, uncomplicated: Secondary | ICD-10-CM | POA: Diagnosis not present

## 2024-01-09 LAB — RAPID URINE DRUG SCREEN, HOSP PERFORMED
Amphetamines: NOT DETECTED
Barbiturates: NOT DETECTED
Benzodiazepines: NOT DETECTED
Cocaine: NOT DETECTED
Opiates: NOT DETECTED
Tetrahydrocannabinol: NOT DETECTED

## 2024-01-09 LAB — CBC
HCT: 41.3 % (ref 39.0–52.0)
Hemoglobin: 14 g/dL (ref 13.0–17.0)
MCH: 31.4 pg (ref 26.0–34.0)
MCHC: 33.9 g/dL (ref 30.0–36.0)
MCV: 92.6 fL (ref 80.0–100.0)
Platelets: 137 10*3/uL — ABNORMAL LOW (ref 150–400)
RBC: 4.46 MIL/uL (ref 4.22–5.81)
RDW: 15.1 % (ref 11.5–15.5)
WBC: 5.4 10*3/uL (ref 4.0–10.5)
nRBC: 0 % (ref 0.0–0.2)

## 2024-01-09 LAB — HIV ANTIBODY (ROUTINE TESTING W REFLEX): HIV Screen 4th Generation wRfx: NONREACTIVE

## 2024-01-09 LAB — HEPATIC FUNCTION PANEL
ALT: 99 U/L — ABNORMAL HIGH (ref 0–44)
AST: 162 U/L — ABNORMAL HIGH (ref 15–41)
Albumin: 3 g/dL — ABNORMAL LOW (ref 3.5–5.0)
Alkaline Phosphatase: 117 U/L (ref 38–126)
Bilirubin, Direct: 0.2 mg/dL (ref 0.0–0.2)
Indirect Bilirubin: 0.4 mg/dL (ref 0.3–0.9)
Total Bilirubin: 0.6 mg/dL (ref 0.0–1.2)
Total Protein: 6.7 g/dL (ref 6.5–8.1)

## 2024-01-09 LAB — BASIC METABOLIC PANEL WITH GFR
Anion gap: 12 (ref 5–15)
BUN: <5 mg/dL — ABNORMAL LOW (ref 6–20)
CO2: 22 mmol/L (ref 22–32)
Calcium: 8 mg/dL — ABNORMAL LOW (ref 8.9–10.3)
Chloride: 110 mmol/L (ref 98–111)
Creatinine, Ser: 0.82 mg/dL (ref 0.61–1.24)
GFR, Estimated: >60 mL/min (ref >60)
Glucose, Bld: 132 mg/dL — ABNORMAL HIGH (ref 70–99)
Potassium: 3.5 mmol/L (ref 3.5–5.1)
Sodium: 144 mmol/L (ref 135–145)

## 2024-01-09 LAB — PHOSPHORUS: Phosphorus: 3.9 mg/dL (ref 2.5–4.6)

## 2024-01-09 LAB — MRSA NEXT GEN BY PCR, NASAL: MRSA by PCR Next Gen: NOT DETECTED

## 2024-01-09 LAB — MAGNESIUM: Magnesium: 1.8 mg/dL (ref 1.7–2.4)

## 2024-01-09 MED ORDER — FOLIC ACID 1 MG PO TABS: 1.0000 mg | ORAL_TABLET | Freq: Every day | ORAL | Status: DC

## 2024-01-09 MED ORDER — CHLORHEXIDINE GLUCONATE CLOTH 2 % EX PADS
6.0000 | MEDICATED_PAD | Freq: Every day | CUTANEOUS | Status: DC
  Administered 2024-01-09: 6 via TOPICAL

## 2024-01-09 MED ORDER — PANTOPRAZOLE SODIUM 40 MG PO TBEC: 40.0000 mg | DELAYED_RELEASE_TABLET | Freq: Every day | ORAL | Status: DC

## 2024-01-09 MED ORDER — ORAL CARE MOUTH RINSE: 15.0000 mL | OROMUCOSAL | Status: DC | PRN

## 2024-01-09 MED ORDER — ORAL CARE MOUTH RINSE
15.0000 mL | OROMUCOSAL | Status: DC
  Administered 2024-01-09 (×4): 15 mL via OROMUCOSAL

## 2024-01-09 MED ORDER — CALCIUM GLUCONATE-NACL 1-0.675 GM/50ML-% IV SOLN
1.0000 g | Freq: Once | INTRAVENOUS | Status: AC
  Administered 2024-01-09: 1000 mg via INTRAVENOUS
  Filled 2024-01-09: qty 50

## 2024-01-09 MED ORDER — MAGNESIUM SULFATE 2 GM/50ML IV SOLN
2.0000 g | Freq: Once | INTRAVENOUS | Status: AC
  Administered 2024-01-09: 2 g via INTRAVENOUS
  Filled 2024-01-09: qty 50

## 2024-01-09 MED ORDER — THIAMINE MONONITRATE 100 MG PO TABS
100.0000 mg | ORAL_TABLET | Freq: Every day | ORAL | Status: DC
Start: 2024-01-10 — End: 2024-01-09

## 2024-01-09 NOTE — Procedures (Signed)
 01/09/2024 Propofol turned off Cuff deflated, patient suctioned and extubated to 4LPM Some apneas improved with stimulation  Myrla Halsted MD PCCM

## 2024-01-09 NOTE — Progress Notes (Signed)
 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 Warrick Parisian.

## 2024-01-09 NOTE — Progress Notes (Signed)
   NAMEAswad Kim, MRN:  [id number], DOB:  [birth year], LOS: 1 ADMISSION DATE:  01/08/2024, CONSULTATION DATE:  01/08/24 REFERRING MD:  Silverio Lay CHIEF COMPLAINT:  AMS   History of Present Illness:  Pt is encephelopathic; therefore, this HPI is obtained from chart review. Brian Kim is a 43 y.o. male who has no known PMH. He was brought to [facility] ED 4/8 after being found unresponsive. Per family, he had been out drinking all day and they later found him in a car in the driveway unresponsive. EMS was called and on their arrival, he had GCS 6.  In ED, he remained minimally responsive. He was subsequently intubated for airway protection. Head CT and UDS pending.  PCCM called for ICU admission.  Pertinent  Medical History:  has Alcohol intoxication (HCC) on their problem list.  Significant Hospital Events: Including procedures, antibiotic start and stop dates in addition to other pertinent events   4/8 admit.  Interim History / Subjective:  Trying to pull out tube  Objective:  Blood pressure 104/72, pulse 85, temperature 98.2 F (36.8 C), resp. rate 18, weight 99.7 kg, SpO2 99%.    Vent Mode: PRVC FiO2 (%):  [40 %-100 %] 40 % Set Rate:  [18 bmp] 18 bmp Vt Set:  [530 mL] 530 mL PEEP:  [5 cmH20] 5 cmH20 Plateau Pressure:  [18 cmH20] 18 cmH20   Intake/Output Summary (Last 24 hours) at 01/09/2024 0717 Last data filed at 01/09/2024 0600 Gross per 24 hour  Intake 1101.92 ml  Output 950 ml  Net 151.92 ml   Filed Weights   01/08/24 2152 01/09/24 0500  Weight: 99.8 kg 99.7 kg    Examination: No distress Strong cough Withdraws Lungs clear Heart sounds regular  Labs okay  Labs/imaging personally reviewed:  CT head 4/8 > neg  Assessment & Plan:  Alcohol intoxication EtOH hepatitis mild  - Wean to extubate - Thiamine, mvn - Watch for s/s of w/d, consider phenobarb if starts - Substance abuse counseling   Best practice (evaluated daily):  Diet/type: NPO DVT prophylaxis:  prophylactic heparin  Pressure ulcer(s): pressure ulcer assessment deferred  GI prophylaxis: PPI Lines: N/A Foley:  Yes, and it is still needed Code Status:  full code Last date of multidisciplinary goals of care discussion: None yet.  31 min cc time Myrla Halsted MD PCCM

## 2024-01-09 NOTE — Progress Notes (Signed)
 eLink Physician-Brief Progress Note Patient Name: Alson Mcpheeters DOB: 05/13/1981 MRN: [id number]   Date of Service  01/09/2024  HPI/Events of Note  Patient is intubated and mechanically ventilated. He just attempted to grab his ET tube.  eICU Interventions  Bilateral soft wrist restraints ordered to prevent self-extubation.        Lamiya Naas U Angeldejesus Callaham 01/09/2024, 1:27 AM

## 2024-01-09 NOTE — Progress Notes (Signed)
 Patient removed IV's and called for assistance. Upon RN arrival in the room he stated he was ready to go. Myrla Halsted, MD notified. Patient given and signed AMA paperwork and paper scrubs. Patient ambulated off the unit with mom, who was his designated driver.

## 2024-01-09 NOTE — Progress Notes (Signed)
 eLink Physician-Brief Progress Note Patient Name: [place]on Yearby DOB: 1980/11/28 MRN: [id number]   Date of Service  01/09/2024  HPI/Events of Note  Patient is  an alcoholic admitted with altered mental status from alcohol intoxication, acute respiratory failure related to his metabolic encephalopathy, and acute ETOH hepatitis, patient is intubated and mechanically ventilated.  eICU Interventions  New Patient Evaluation.        Nyeshia Mysliwiec U Madissen Wyse 01/09/2024, 1:06 AM

## 2024-01-09 NOTE — TOC Initial Note (Addendum)
 Transition of Care [facility]) - Initial/Assessment Note    Patient Details  Name: Brian Kim MRN: [id number] Date of Birth: 1980/11/17  [facility] CM/SW Contact:    Lamonte Sakai, Student-Social Work Phone Number: 01/09/2024, 12:13 PM  Clinical Narrative:                  Pt admitted from home. Pt uninsured, TOC following for needs.        Patient Goals and CMS Choice            Expected Discharge Plan and Services       Living arrangements for the past 2 months: Single Family Home                                      Prior Living Arrangements/Services Living arrangements for the past 2 months: Single Family Home                     Activities of Daily Living      Permission Sought/Granted                  Emotional Assessment       Orientation: : Oriented to Self, Oriented to Place, Oriented to  Time      Admission diagnosis:  Alcohol intoxication [facility]) [F10.929] Patient Active Problem List   Diagnosis Date Noted   Alcohol intoxication (HCC) 01/08/2024   PCP:  Pcp, No Pharmacy:   [facility] [address] [place] [address] Phone: [phone] Fax: [phone]     Social Drivers of Health (SDOH) Social History:   SDOH Interventions:     Readmission Risk Interventions     No data to display

## 2024-07-19 ENCOUNTER — Emergency Department (HOSPITAL_COMMUNITY)
Admission: EM | Admit: 2024-07-19 | Discharge: 2024-07-19 | Disposition: A | Discharge status: Home / Self Care | Attending: Emergency Medicine | Admitting: Emergency Medicine

## 2024-07-19 ENCOUNTER — Emergency Department (HOSPITAL_COMMUNITY)

## 2024-07-19 ENCOUNTER — Other Ambulatory Visit: Payer: Self-pay

## 2024-07-19 DIAGNOSIS — S5001XA Contusion of right elbow, initial encounter: Secondary | ICD-10-CM | POA: Insufficient documentation

## 2024-07-19 DIAGNOSIS — Y92143 Cell of prison as the place of occurrence of the external cause: Secondary | ICD-10-CM | POA: Insufficient documentation

## 2024-07-19 DIAGNOSIS — W228XXA Striking against or struck by other objects, initial encounter: Secondary | ICD-10-CM | POA: Diagnosis not present

## 2024-07-19 DIAGNOSIS — S01111A Laceration without foreign body of right eyelid and periocular area, initial encounter: Secondary | ICD-10-CM | POA: Diagnosis present

## 2024-07-19 DIAGNOSIS — M25551 Pain in right hip: Secondary | ICD-10-CM | POA: Insufficient documentation

## 2024-07-19 DIAGNOSIS — S0181XA Laceration without foreign body of other part of head, initial encounter: Secondary | ICD-10-CM

## 2024-07-19 MED ORDER — LIDOCAINE-EPINEPHRINE (PF) 2 %-1:200000 IJ SOLN
20.0000 mL | Freq: Once | INTRAMUSCULAR | Status: AC
  Administered 2024-07-19: 20 mL via INTRADERMAL
  Filled 2024-07-19: qty 20

## 2024-07-19 NOTE — Discharge Instructions (Signed)
 For pain control you may take 1000 mg of acetaminophen (Tylenol) every 8 hours and/or 600 mg of Ibuprofen (Motrin, Advil, etc.) every 6-8 hours as needed.  Please limit acetaminophen (Tylenol) to 4000 mg and Ibuprofen (Motrin, Advil, etc.) to 2400 mg for a 24hr period. Please note that other over-the-counter medicine may contain acetaminophen or ibuprofen as a component of their ingredients.

## 2024-07-19 NOTE — ED Provider Notes (Signed)
 [facility] Provider Note  CSN: [id number] Arrival date & time: 07/19/24 0032  Chief Complaint(s) Laceration  HPI Brian Kim is a 43 y.o. male brought in by [employer] [facility] jail.  Patient has been incarcerated for the past 4 days.  Was reportedly seen banging his head on the door of his jail cell.  He sustained a laceration to the right eyebrow that the medical team attempted to closed using Steri-Strips however it continued to bleed prompting his transfer here.  He is up-to-date on his tetanus vaccine as of 4 to 5 years ago per patient.  Denies any loss of consciousness.  Denies any neck pain or back pain.  Endorses right hip and elbow pain from a prior fall.  The history is provided by the patient.    Past Medical History No past medical history on file. Patient Active Problem List   Diagnosis Date Noted   Alcohol intoxication 01/08/2024   Home Medication(s) Prior to Admission medications   Medication Sig Start Date End Date Taking? Authorizing Provider  acetaminophen  (TYLENOL ) 325 MG tablet Take 650-975 mg by mouth every 6 (six) hours as needed for moderate pain.    [provider]  calcium  carbonate (TUMS - DOSED IN MG ELEMENTAL CALCIUM ) 500 MG chewable tablet Chew 1 tablet by mouth 3 (three) times daily.    [provider]  cetirizine  (ZYRTEC  ALLERGY) 10 MG tablet Take 1 tablet (10 mg total) by mouth daily. 03/18/23   Rising, Asberry, PA-C  olopatadine  (PATANOL) 0.1 % ophthalmic solution Place 1 drop into both eyes 2 (two) times daily. 03/18/23   Rising, Asberry, PA-C                                                                                                                                    Allergies Anesthetics, amide  Review of Systems Review of Systems As noted in HPI  Physical Exam Vital Signs  I have reviewed the triage vital signs BP (!) 146/108 (BP Location: Right Arm)   Pulse 99   Temp 98 F  (36.7 C)   Resp 13   Ht 5' 8 (1.727 m)   SpO2 100%   BMI 33.42 kg/m   Physical Exam Vitals reviewed.  Constitutional:      General: He is not in acute distress.    Appearance: He is well-developed. He is not diaphoretic.  HENT:     Head: Normocephalic. Contusion and laceration present.     Right Ear: External ear normal.     Left Ear: External ear normal.     Nose: Nose normal.     Mouth/Throat:     Mouth: Mucous membranes are moist.  Eyes:     General: No scleral icterus.    Conjunctiva/sclera: Conjunctivae normal.   Neck:     Trachea: Phonation normal.  Cardiovascular:     Rate and Rhythm:  Normal rate and regular rhythm.  Pulmonary:     Effort: Pulmonary effort is normal. No respiratory distress.     Breath sounds: No stridor.  Abdominal:     General: There is no distension.  Musculoskeletal:        General: Normal range of motion.     Right elbow: Swelling present. Normal range of motion. Tenderness present. No radial head, medial epicondyle, lateral epicondyle or olecranon process tenderness.     Right wrist: Normal pulse.     Left wrist: Normal pulse.     Cervical back: Normal range of motion.     Right hip: Tenderness (mild) present. No bony tenderness.  Neurological:     Mental Status: He is alert and oriented to person, place, and time.  Psychiatric:        Behavior: Behavior normal.     ED Results and Treatments Labs (all labs ordered are listed, but only abnormal results are displayed) Labs Reviewed - No data to display                                                                                                                       EKG  EKG Interpretation Date/Time:    Ventricular Rate:    PR Interval:    QRS Duration:    QT Interval:    QTC Calculation:   R Axis:      Text Interpretation:         Radiology DG Elbow Complete Right Result Date: 07/19/2024 EXAM: 3 VIEW(S) XRAY OF THE RIGHT ELBOW COMPARISON: None available. CLINICAL  HISTORY: elbow pain. Pain and contusion near elbow region (RT) FINDINGS: BONES AND JOINTS: No acute fracture. No focal osseous lesion. No joint dislocation. No joint effusion. SOFT TISSUES: Subcutaneous soft tissue edema along the lateral aspect. IMPRESSION: 1. No acute osseous abnormality. 2. Lateral subcutaneous soft tissue edema. Electronically signed by: Norman Gatlin MD 07/19/2024 01:43 AM EDT RP Workstation: [id number]    Medications Ordered in ED Medications  lidocaine-EPINEPHrine (XYLOCAINE W/EPI) 2 %-1:200000 (PF) injection 20 mL (has no administration in time range)   Procedures .Laceration Repair  Date/Time: 07/19/2024 2:21 AM  Performed by: Trine Raynell Moder, MD Authorized by: Trine Raynell Moder, MD   Consent:    Consent obtained:  Verbal   Consent given by:  Patient   Risks discussed:  Pain, poor cosmetic result and poor wound healing Universal protocol:    Patient identity confirmed:  Verbally with patient Anesthesia:    Anesthesia method:  Local infiltration   Local anesthetic:  Lidocaine 2% WITH epi Laceration details:    Location:  Face   Face location:  R eyebrow   Length (cm):  2.7   Depth (mm):  3 Exploration:    Limited defect created (wound extended): no     Hemostasis achieved with:  Tied off vessels   Wound extent: no foreign body     Contaminated: no   Treatment:    Area cleansed with:  Saline   Amount of cleaning:  Standard   Irrigation solution:  Sterile saline   Irrigation method:  Pressure wash   Debridement:  None   Layers/structures repaired:  Deep dermal/superficial fascia Deep dermal/superficial fascia:    Suture size:  4-0   Suture material:  Vicryl   Suture technique:  Figure eight   Number of sutures:  1 Skin repair:    Repair method:  Sutures   Suture size:  5-0   Suture material:  Fast-absorbing gut   Suture technique:  Simple interrupted and running locked   Number of sutures:  5 Approximation:    Approximation:   Close Repair type:    Repair type:  Intermediate Post-procedure details:    Procedure completion:  Tolerated Comments:     Dermabond applied over sutures   (including critical care time) Medical Decision Making / ED Course   Medical Decision Making Amount and/or Complexity of Data Reviewed Radiology: ordered and independent interpretation performed. Decision-making details documented in ED Course.  Risk Prescription drug management.    Right eyebrow laceration irrigated and closed as above. Right elbow pain and swelling -x-ray negative for fracture or dislocation.  No bony lesion.  Likely contusion.    Final Clinical Impression(s) / ED Diagnoses Final diagnoses:  Facial laceration, initial encounter  Contusion of right elbow, initial encounter   The patient appears reasonably screened and/or stabilized for discharge and I doubt any other medical condition or other [facility] requiring further screening, evaluation, or treatment in the ED at this time. I have discussed the findings, Dx and Tx plan with the patient/family who expressed understanding and agree(s) with the plan. Discharge instructions discussed at length. The patient/family was given strict return precautions who verbalized understanding of the instructions. No further questions at time of discharge.  Disposition: Discharge  Condition: Good  ED Discharge Orders     None       Follow Up: Primary care provider  Schedule an appointment as soon as possible for a visit  as needed    This chart was dictated using voice recognition software.  Despite best efforts to proofread,  errors can occur which can change the documentation meaning.    Trine Raynell Moder, MD 07/19/24 [phone]

## 2024-07-19 NOTE — ED Triage Notes (Signed)
 Patient coming in under custody c/o head laceration and pain to right hip and shoulder. Per [employer] patient had been banging his head against the wall and that's how he got injured. Pain 10/10.
